# Patient Record
Sex: Female | Born: 2000 | Hispanic: Yes | Marital: Single | Smoking: Never smoker
Health system: Southern US, Community
[De-identification: ages and names within clinical notes are randomized; demographics above are authoritative.]

## PROBLEM LIST (undated history)

## (undated) ENCOUNTER — Inpatient Hospital Stay: Payer: Self-pay

## (undated) DIAGNOSIS — K8020 Calculus of gallbladder without cholecystitis without obstruction: Secondary | ICD-10-CM

## (undated) DIAGNOSIS — O9081 Anemia of the puerperium: Secondary | ICD-10-CM

## (undated) HISTORY — PX: NO PAST SURGERIES: SHX2092

---
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
## 2018-01-05 ENCOUNTER — Ambulatory Visit: Payer: Medicaid Other

## 2018-01-11 ENCOUNTER — Ambulatory Visit: Payer: Medicaid Other | Attending: Certified Nurse Midwife

## 2018-06-16 ENCOUNTER — Ambulatory Visit: Payer: Medicaid Other | Admitting: Certified Nurse Midwife

## 2018-06-16 VITALS — BP 111/59 | HR 64 | Ht 62.0 in | Wt 133.4 lb

## 2018-06-16 DIAGNOSIS — Z3046 Encounter for surveillance of implantable subdermal contraceptive: Secondary | ICD-10-CM

## 2018-06-16 NOTE — Progress Notes (Signed)
Pt is here for a nexplanon removal. Does not want any other form of bc.

## 2018-06-16 NOTE — Patient Instructions (Signed)
Preventive Care 18-39 Years, Female Preventive care refers to lifestyle choices and visits with your health care provider that can promote health and wellness. What does preventive care include?  A yearly physical exam. This is also called an annual well check.  Dental exams once or twice a year.  Routine eye exams. Ask your health care provider how often you should have your eyes checked.  Personal lifestyle choices, including: ? Daily care of your teeth and gums. ? Regular physical activity. ? Eating a healthy diet. ? Avoiding tobacco and drug use. ? Limiting alcohol use. ? Practicing safe sex. ? Taking vitamin and mineral supplements as recommended by your health care provider. What happens during an annual well check? The services and screenings done by your health care provider during your annual well check will depend on your age, overall health, lifestyle risk factors, and family history of disease. Counseling Your health care provider may ask you questions about your:  Alcohol use.  Tobacco use.  Drug use.  Emotional well-being.  Home and relationship well-being.  Sexual activity.  Eating habits.  Work and work Statistician.  Method of birth control.  Menstrual cycle.  Pregnancy history.  Screening You may have the following tests or measurements:  Height, weight, and BMI.  Diabetes screening. This is done by checking your blood sugar (glucose) after you have not eaten for a while (fasting).  Blood pressure.  Lipid and cholesterol levels. These may be checked every 5 years starting at age 66.  Skin check.  Hepatitis C blood test.  Hepatitis B blood test.  Sexually transmitted disease (STD) testing.  BRCA-related cancer screening. This may be done if you have a family history of breast, ovarian, tubal, or peritoneal cancers.  Pelvic exam and Pap test. This may be done every 3 years starting at age 40. Starting at age 59, this may be done every 5  years if you have a Pap test in combination with an HPV test.  Discuss your test results, treatment options, and if necessary, the need for more tests with your health care provider. Vaccines Your health care provider may recommend certain vaccines, such as:  Influenza vaccine. This is recommended every year.  Tetanus, diphtheria, and acellular pertussis (Tdap, Td) vaccine. You may need a Td booster every 10 years.  Varicella vaccine. You may need this if you have not been vaccinated.  HPV vaccine. If you are 69 or younger, you may need three doses over 6 months.  Measles, mumps, and rubella (MMR) vaccine. You may need at least one dose of MMR. You may also need a second dose.  Pneumococcal 13-valent conjugate (PCV13) vaccine. You may need this if you have certain conditions and were not previously vaccinated.  Pneumococcal polysaccharide (PPSV23) vaccine. You may need one or two doses if you smoke cigarettes or if you have certain conditions.  Meningococcal vaccine. One dose is recommended if you are age 27-21 years and a first-year college student living in a residence hall, or if you have one of several medical conditions. You may also need additional booster doses.  Hepatitis A vaccine. You may need this if you have certain conditions or if you travel or work in places where you may be exposed to hepatitis A.  Hepatitis B vaccine. You may need this if you have certain conditions or if you travel or work in places where you may be exposed to hepatitis B.  Haemophilus influenzae type b (Hib) vaccine. You may need this if  you have certain risk factors.  Talk to your health care provider about which screenings and vaccines you need and how often you need them. This information is not intended to replace advice given to you by your health care provider. Make sure you discuss any questions you have with your health care provider. Document Released: 01/18/2002 Document Revised: 08/11/2016  Document Reviewed: 09/23/2015 Elsevier Interactive Patient Education  [employer].

## 2018-06-16 NOTE — Progress Notes (Signed)
Susan Santos is a 17 y.o. year old Hispanic female here for Implanon removal.  Patient given informed consent for removal of her Implanon.  BP (!) 111/59   Pulse 64   Ht 5\' 2"  (1.575 m)   Wt 133 lb 7 oz (60.5 kg)   LMP 05/03/2018 (Approximate)   BMI 24.41 kg/m   Appropriate time out taken. Implanon site identified.  Area prepped in usual sterile fashon. One cc of 2% lidocaine was used to anesthetize the area at the distal end of the implant. A small stab incision was made right beside the implant on the distal portion.  The Implanon rod was grasped using hemostats and removed without difficulty.  There was less than 3 cc blood loss. There were no complications.  Steri-strips were applied over the small incision and a pressure bandage was applied.  The patient tolerated the procedure well.  She was instructed to keep the area clean and dry, remove pressure bandage in 24 hours, and keep insertion site covered with the steri-strip for 3-5 days.    Declines different method of contraception at this time. Encouraged condom use.   RTC as needed.    Gunnar BullaJenkins Michelle Meri Pelot, CNM [facility], [facility]

## 2019-02-19 ENCOUNTER — Emergency Department: Payer: Self-pay

## 2019-02-19 ENCOUNTER — Encounter: Payer: Self-pay | Admitting: Emergency Medicine

## 2019-02-19 ENCOUNTER — Other Ambulatory Visit: Payer: Self-pay

## 2019-02-19 ENCOUNTER — Emergency Department
Admission: EM | Admit: 2019-02-19 | Discharge: 2019-02-19 | Disposition: A | Discharge status: Home / Self Care | Payer: Self-pay | Attending: Emergency Medicine | Admitting: Emergency Medicine

## 2019-02-19 DIAGNOSIS — K8050 Calculus of bile duct without cholangitis or cholecystitis without obstruction: Secondary | ICD-10-CM | POA: Insufficient documentation

## 2019-02-19 DIAGNOSIS — R109 Unspecified abdominal pain: Secondary | ICD-10-CM

## 2019-02-19 DIAGNOSIS — K8020 Calculus of gallbladder without cholecystitis without obstruction: Secondary | ICD-10-CM

## 2019-02-19 LAB — POCT PREGNANCY, URINE: Preg Test, Ur: NEGATIVE

## 2019-02-19 LAB — CBC WITH DIFFERENTIAL/PLATELET
ABS IMMATURE GRANULOCYTES: 0.04 10*3/uL (ref 0.00–0.07)
BASOS PCT: 1 %
Basophils Absolute: 0.1 10*3/uL (ref 0.0–0.1)
Eosinophils Absolute: 0.1 10*3/uL (ref 0.0–0.5)
Eosinophils Relative: 1 %
HCT: 37.7 % (ref 36.0–46.0)
HEMOGLOBIN: 12.5 g/dL (ref 12.0–15.0)
IMMATURE GRANULOCYTES: 1 %
LYMPHS PCT: 21 %
Lymphs Abs: 1.9 10*3/uL (ref 0.7–4.0)
MCH: 29.7 pg (ref 26.0–34.0)
MCHC: 33.2 g/dL (ref 30.0–36.0)
MCV: 89.5 fL (ref 80.0–100.0)
MONO ABS: 0.4 10*3/uL (ref 0.1–1.0)
Monocytes Relative: 5 %
NEUTROS ABS: 6.4 10*3/uL (ref 1.7–7.7)
NEUTROS PCT: 71 %
PLATELETS: 230 10*3/uL (ref 150–400)
RBC: 4.21 MIL/uL (ref 3.87–5.11)
RDW: 12.2 % (ref 11.5–15.5)
WBC: 8.9 10*3/uL (ref 4.0–10.5)
nRBC: 0 % (ref 0.0–0.2)

## 2019-02-19 LAB — COMPREHENSIVE METABOLIC PANEL
ALK PHOS: 46 U/L (ref 38–126)
ALT: 8 U/L (ref 0–44)
AST: 13 U/L — AB (ref 15–41)
Albumin: 4 g/dL (ref 3.5–5.0)
Anion gap: 6 (ref 5–15)
BUN: 14 mg/dL (ref 6–20)
CHLORIDE: 106 mmol/L (ref 98–111)
CO2: 25 mmol/L (ref 22–32)
Calcium: 8.9 mg/dL (ref 8.9–10.3)
Creatinine, Ser: 0.66 mg/dL (ref 0.44–1.00)
GFR calc Af Amer: >60 mL/min (ref >60)
GFR calc non Af Amer: >60 mL/min (ref >60)
Glucose, Bld: 99 mg/dL (ref 70–99)
POTASSIUM: 4.2 mmol/L (ref 3.5–5.1)
SODIUM: 137 mmol/L (ref 135–145)
TOTAL PROTEIN: 7.4 g/dL (ref 6.5–8.1)
Total Bilirubin: 0.6 mg/dL (ref 0.3–1.2)

## 2019-02-19 LAB — URINALYSIS, COMPLETE (UACMP) WITH MICROSCOPIC
BILIRUBIN URINE: NEGATIVE
GLUCOSE, UA: NEGATIVE mg/dL
HGB URINE DIPSTICK: NEGATIVE
KETONES UR: NEGATIVE mg/dL
LEUKOCYTE UA: NEGATIVE
NITRITE: NEGATIVE
PH: 5 (ref 5.0–8.0)
Protein, ur: NEGATIVE mg/dL
SPECIFIC GRAVITY, URINE: 1.031 — AB (ref 1.005–1.030)

## 2019-02-19 LAB — LIPASE, BLOOD: LIPASE: 31 U/L (ref 11–51)

## 2019-02-19 MED ORDER — ONDANSETRON HCL 4 MG/2ML IJ SOLN
4.0000 mg | Freq: Once | INTRAMUSCULAR | Status: AC
  Administered 2019-02-19: 4 mg via INTRAVENOUS
  Filled 2019-02-19: qty 2

## 2019-02-19 MED ORDER — MORPHINE SULFATE (PF) 2 MG/ML IV SOLN
2.0000 mg | Freq: Once | INTRAVENOUS | Status: AC
  Administered 2019-02-19: 2 mg via INTRAVENOUS
  Filled 2019-02-19: qty 1

## 2019-02-19 MED ORDER — SODIUM CHLORIDE 0.9 % IV BOLUS
500.0000 mL | Freq: Once | INTRAVENOUS | Status: AC
  Administered 2019-02-19: 500 mL via INTRAVENOUS

## 2019-02-19 MED ORDER — MORPHINE SULFATE (PF) 4 MG/ML IV SOLN
3.0000 mg | Freq: Once | INTRAVENOUS | Status: AC
  Administered 2019-02-19: 3 mg via INTRAVENOUS
  Filled 2019-02-19: qty 1

## 2019-02-19 MED ORDER — KETOROLAC TROMETHAMINE 30 MG/ML IJ SOLN
30.0000 mg | Freq: Four times a day (QID) | INTRAMUSCULAR | Status: DC
  Administered 2019-02-19: 30 mg via INTRAVENOUS
  Filled 2019-02-19: qty 1

## 2019-02-19 NOTE — ED Notes (Signed)
Dr. Fanny Bien made aware pt tolerating PO

## 2019-02-19 NOTE — ED Triage Notes (Signed)
Pt states right upper rib pain that began a few weeks ago, went away and now is back, states pain radiates around to the right mid back as well. Appears in NAD. States she vomited this am.

## 2019-02-19 NOTE — Discharge Instructions (Addendum)
? ?  Please return to the emergency room right away if you are to develop a fever, severe nausea, your pain becomes severe or worsens, you are unable to keep food down, begin vomiting any dark or bloody fluid, you develop any dark or bloody stools, feel dehydrated, or other new concerns or symptoms arise. ? ?

## 2019-02-19 NOTE — ED Provider Notes (Signed)
[facility] Emergency Department Provider Note  ____________________________________________   First MD Initiated Contact with Patient 02/19/19 0825     (approximate)  I have reviewed the triage vital signs and the nursing notes.   HISTORY  Chief Complaint Abdominal Pain  HPI Susan Santos is a 18 y.o. female here for evaluation for upper abdominal pain  Patient reports that around 11 PM last night she started having pretty severe abdominal pain located in her right upper abdomen.  It was about a 10 out of 10 and she vomited once.  The pain is come down now to about a 5 out of 10 on its own after she took some over-the-counter pain medication which she is not quite certain what it was.  She reports no ongoing nausea or vomiting.  No fevers or chills.  Pain is moderate felt like a squeezing or pressing pain in her right upper abdomen that radiated to the back.  Is now seemingly improving slowly.  Denies pregnancy.  Reports last menstrual cycle about 1 month ago.  No lower abdominal pain.  No pelvic pain or discharge.  Reports she had this pain once before about a month ago and it went away on its own but she did not seek medical treatment at that time.  Past Medical History:  Diagnosis Date  . Postpartum anemia 07/26/2017    Patient Active Problem List   Diagnosis Date Noted  . Postpartum anemia 07/26/2017    History reviewed. No pertinent surgical history.  Prior to Admission medications   Not on File    Allergies Patient has no known allergies.  Family History  Problem Relation Age of Onset  . Diabetes Mother     Social History Social History   Tobacco Use  . Smoking status: Never Smoker  . Smokeless tobacco: Never Used  Substance Use Topics  . Alcohol use: No  . Drug use: No    Review of Systems Constitutional: No fever/chills Eyes: No visual changes. ENT: No sore throat. Cardiovascular: Denies chest pain. Respiratory:  Denies shortness of breath. Gastrointestinal: No abdominal pain.   Genitourinary: Negative for dysuria. Musculoskeletal: Negative for back pain. Skin: Negative for rash. Neurological: Negative for headaches, areas of focal weakness or numbness.    ____________________________________________   PHYSICAL EXAM:  VITAL SIGNS: ED Triage Vitals  Enc Vitals Group     BP 02/19/19 0833 99/69     Pulse Rate 02/19/19 0833 (!) 55     Resp 02/19/19 0833 16     Temp 02/19/19 0833 98 F (36.7 C)     Temp Source 02/19/19 0833 Oral     SpO2 02/19/19 0833 100 %     Weight 02/19/19 0741 120 lb (54.4 kg)     Height 02/19/19 0741 5\' 2"  (1.575 m)     Head Circumference --      Peak Flow --      Pain Score 02/19/19 0740 5     Pain Loc --      Pain Edu? --      Excl. in GC? --     Constitutional: Alert and oriented. Well appearing and in no acute distress. Eyes: Conjunctivae are normal. Head: Atraumatic. Nose: No congestion/rhinnorhea. Mouth/Throat: Mucous membranes are moist. Neck: No stridor.  Cardiovascular: Normal rate, regular rhythm. Grossly normal heart sounds.  Good peripheral circulation. Respiratory: Normal respiratory effort.  No retractions. Lungs CTAB. Gastrointestinal: Soft and nontender except for some mild discomfort and a slightly positive Eulah Pont  in the right upper quadrant without rebound or guarding.  There is no lower abdominal tenderness or left upper quadrant pain. No distention.  There is no peritonitis. Musculoskeletal: No lower extremity tenderness nor edema. Neurologic:  Normal speech and language. No gross focal neurologic deficits are appreciated.  Skin:  Skin is warm, dry and intact. No rash noted. Psychiatric: Mood and affect are normal. Speech and behavior are normal.  ____________________________________________   LABS (all labs ordered are listed, but only abnormal results are displayed)  Labs Reviewed  COMPREHENSIVE METABOLIC PANEL - Abnormal; Notable  for the following components:      Result Value   AST 13 (*)    All other components within normal limits  URINALYSIS, COMPLETE (UACMP) WITH MICROSCOPIC - Abnormal; Notable for the following components:   Color, Urine YELLOW (*)    APPearance CLOUDY (*)    Specific Gravity, Urine 1.031 (*)    Bacteria, UA RARE (*)    All other components within normal limits  CBC WITH DIFFERENTIAL/PLATELET  LIPASE, BLOOD  POC URINE PREG, ED  POCT PREGNANCY, URINE   ____________________________________________  EKG   ____________________________________________  RADIOLOGY  Right upper quadrant ultrasound reviewed by me.  Cholelithiasis.  No signs of cholecystitis noted ____________________________________________   PROCEDURES  Procedure(s) performed: None  Procedures  Critical Care performed: No  ____________________________________________   INITIAL IMPRESSION / ASSESSMENT AND PLAN / ED COURSE  Pertinent labs & imaging results that were available during my care of the patient were reviewed by me and considered in my medical decision making (see chart for details).   Right upper quadrant pathology after eating.  Recurrent symptomatology had similar about a month ago.  Currently afebrile pain is subsiding with over-the-counter medication.  I suspect this is likely hepatobiliary in nature likely cholelithiasis less likely cholecystitis as she is afebrile normal white count without peritonitis and her pain is subsiding.  No signs or symptoms that would suggest acute pancreatitis, appendicitis, or acute abdomen denoted.  Clinical Course as of Feb 19 1347  Mon Feb 19, 2019  1017 General surgery consultation placed.  None symptomatic cholelithiasis suspected, no obvious evidence of cholecystitis.  Patient does however note mild ongoing right upper quadrant pain.  She is fully awake and alert, will place a consultation for further treatment recommendations.  Give additional small dose of  morphine at this time.   [MQ]  1251 Patient is resting comfortably, Dr. Aleen Campi of general surgery has seen her and recommends discharge as long as she passes p.o. challenge and follow-up in the clinic this week.  Biliary colic.  Patient comfortable resting at this time agreeable   [MQ]    Clinical Course User Index [MQ] Sharyn Creamer, MD    Patient did well with p.o. challenge, comfortable plan for discharge and will follow-up in general surgery clinic.  Careful return precautions advised ____________________________________________   FINAL CLINICAL IMPRESSION(S) / ED DIAGNOSES  Final diagnoses:  Abdominal pain  Biliary colic        Note:  This document was prepared using Dragon voice recognition software and may include unintentional dictation errors       Sharyn Creamer, MD 02/19/19 1348

## 2019-02-19 NOTE — ED Notes (Signed)
PO challenge-warm soda and apple sauce provided to pt by Dr. Fanny Bien

## 2019-02-19 NOTE — ED Notes (Signed)
Surgery consult happening  at this time

## 2019-02-19 NOTE — Consult Note (Signed)
[facility] SURGICAL CONSULTATION NOTE (initial) - cpt: 82500 (Outpatient/ED)   HISTORY OF PRESENT ILLNESS (HPI):  18 y.o. female presented to [facility] ED today for evaluation of abdominal pain. Patient reports that she had the acute onset of burning RUQ abdominal pain radiating to her back. The pain spontaneously resolved. However, she presents today because the pain returned overnight. She notes associated nausea and 1 episode of emesis. No associated fevers, chills, CP, SOB, or bladder/bowel changes. Pain is not associated with food. No history of similar pain in the past. No previous abdominal surgeries. Work up in the ED was concerning for cholelithiasis.   Surgery is consulted by emergency medicine physician Dr. Sharyn Creamer, MD in this context for evaluation and management of symptomatic cholelithiasis.  PAST MEDICAL HISTORY (PMH):  Past Medical History:  Diagnosis Date  . Postpartum anemia 07/26/2017     PAST SURGICAL HISTORY (PSH):  History reviewed. No pertinent surgical history.   MEDICATIONS:  Prior to Admission medications   Not on File     ALLERGIES:  No Known Allergies   SOCIAL HISTORY:  Social History   Socioeconomic History  . Marital status: Single    Spouse name: Not on file  . Number of children: Not on file  . Years of education: Not on file  . Highest education level: Not on file  Occupational History  . Not on file  Social Needs  . Financial resource strain: Not on file  . Food insecurity:    Worry: Not on file    Inability: Not on file  . Transportation needs:    Medical: Not on file    Non-medical: Not on file  Tobacco Use  . Smoking status: Never Smoker  . Smokeless tobacco: Never Used  Substance and Sexual Activity  . Alcohol use: No  . Drug use: No  . Sexual activity: Yes    Birth control/protection: None  Lifestyle  . Physical activity:    Days per week: Not on file    Minutes per session: Not on file  . Stress: Not on  file  Relationships  . Social connections:    Talks on phone: Not on file    Gets together: Not on file    Attends religious service: Not on file    Active member of club or organization: Not on file    Attends meetings of clubs or organizations: Not on file    Relationship status: Not on file  . Intimate partner violence:    Fear of current or ex partner: Not on file    Emotionally abused: Not on file    Physically abused: Not on file    Forced sexual activity: Not on file  Other Topics Concern  . Not on file  Social History Narrative  . Not on file     FAMILY HISTORY:  Family History  Problem Relation Age of Onset  . Diabetes Mother       REVIEW OF SYSTEMS:  Review of Systems  Constitutional: Negative for chills and fever.  Respiratory: Negative for cough and shortness of breath.   Cardiovascular: Negative for chest pain and palpitations.  Gastrointestinal: Positive for abdominal pain, nausea and vomiting. Negative for blood in stool, constipation and diarrhea.  Genitourinary: Negative for dysuria and urgency.  Neurological: Negative for dizziness and headaches.  All other systems reviewed and are negative.   VITAL SIGNS:  Temp:  [98 F (36.7 C)] 98 F (36.7 C) (03/16 0833) Pulse Rate:  [52-55]  52 (03/16 7867) Resp:  [16] 16 (03/16 0833) BP: (99)/(69) 99/69 (03/16 0833) SpO2:  [100 %] 100 % (03/16 0833) Weight:  [54.4 kg] 54.4 kg (03/16 0741)     Height: 5\' 2"  (157.5 cm) Weight: 54.4 kg BMI (Calculated): 21.94   INTAKE/OUTPUT:  This shift: No intake/output data recorded.  Last 2 shifts: @IOLAST2SHIFTS @   PHYSICAL EXAM:  Physical Exam Vitals signs and nursing note reviewed.  Constitutional:      General: She is not in acute distress.    Appearance: She is well-developed and normal weight. She is not ill-appearing.  HENT:     Head: Normocephalic and atraumatic.  Eyes:     General: No scleral icterus.    Extraocular Movements: Extraocular movements intact.   Cardiovascular:     Rate and Rhythm: Normal rate and regular rhythm.     Heart sounds: Normal heart sounds. No murmur. No friction rub. No gallop.   Pulmonary:     Effort: Pulmonary effort is normal. No respiratory distress.     Breath sounds: Normal breath sounds. No wheezing or rhonchi.  Abdominal:     General: Abdomen is flat. There is no distension.     Palpations: Abdomen is soft.     Tenderness: There is abdominal tenderness (Mild) in the right upper quadrant. There is no guarding or rebound. Negative signs include Murphy's sign.  Genitourinary:    Comments: Deferred Skin:    General: Skin is warm and dry.     Coloration: Skin is not jaundiced or pale.  Neurological:     General: No focal deficit present.     Mental Status: She is alert and oriented to person, place, and time.  Psychiatric:        Mood and Affect: Mood normal.        Behavior: Behavior normal.       Labs:  CBC Latest Ref Rng & Units 02/19/2019 12/19/2017 09/19/2017  WBC 4.0 - 10.5 K/uL 8.9 4.6 5.2  Hemoglobin 12.0 - 15.0 g/dL 67.2 09.4 70.9  Hematocrit 36.0 - 46.0 % 37.7 38.7 38.6  Platelets 150 - 400 K/uL 230 189 244   CMP Latest Ref Rng & Units 02/19/2019 12/19/2017 02/07/2014  Glucose 70 - 99 mg/dL 99 628(Z) 87  BUN 6 - 20 mg/dL 14 15 16   Creatinine 0.44 - 1.00 mg/dL 6.62 9.47 6.54  Sodium 135 - 145 mmol/L 137 140 138  Potassium 3.5 - 5.1 mmol/L 4.2 3.9 4.0  Chloride 98 - 111 mmol/L 106 109 107  CO2 22 - 32 mmol/L 25 25 27(H)  Calcium 8.9 - 10.3 mg/dL 8.9 9.3 9.1(L)  Total Protein 6.5 - 8.1 g/dL 7.4 - 7.8  Total Bilirubin 0.3 - 1.2 mg/dL 0.6 - 0.3  Alkaline Phos 38 - 126 U/L 46 - 80  AST 15 - 41 U/L 13(L) - 11(L)  ALT 0 - 44 U/L 8 - 12     Imaging studies:   RUQ [place] (02/19/2019) personally reviewed and radiologist report reviewed:  IMPRESSION: 1. Cholelithiasis. The majority of the stones are mobile, however 1 stone in the gallbladder neck did not move during the course of the examination and  may represent a source for biliary colic. 2. No secondary sonographic findings of acute cholecystitis.   Assessment/Plan: (ICD-10's: K71.20) 18 y.o. female with RUQ abdominal pain most likely attributable to symptomatic cholelithiasis without leukocytosis, cholecystitis, or choledocholithiasis.   - PO Challenge  - Will add 30 mg of Toradol for pain control   -  Given improvement in pain and lack of evidence of cholecystitis, will discharge home with close follow up for discussion of elective cholecystectomy rather than admission for cholecystectomy at this time, which she is understanding of and agreeable with.   - Will recommend low fat diet at home to minimize recurrence  - Tylenol/Motrin for pain  - Follow up with Dr Aleen CampiPiscoya in 1 week   All of the above findings and recommendations were discussed with the patient and the medical team, and all of patient's questions were answered to her expressed satisfaction.  Thank you for the opportunity to participate in this patient's care.   -- Lynden OxfordZachary Schulz, PA-C [facility] 02/19/2019, 11:10 AM [phone] M-F: 7am - 4pm

## 2019-03-07 ENCOUNTER — Encounter: Payer: Self-pay | Admitting: Surgery

## 2019-03-07 ENCOUNTER — Other Ambulatory Visit: Payer: Self-pay

## 2019-03-07 ENCOUNTER — Ambulatory Visit (INDEPENDENT_AMBULATORY_CARE_PROVIDER_SITE_OTHER): Payer: Self-pay | Admitting: Surgery

## 2019-03-07 ENCOUNTER — Telehealth: Payer: Self-pay | Admitting: *Deleted

## 2019-03-07 DIAGNOSIS — K8020 Calculus of gallbladder without cholecystitis without obstruction: Secondary | ICD-10-CM | POA: Insufficient documentation

## 2019-03-07 MED ORDER — OXYCODONE HCL 5 MG PO TABS: 5.0000 mg | ORAL_TABLET | Freq: Four times a day (QID) | ORAL | 0 refills | Status: DC | PRN

## 2019-03-07 NOTE — Progress Notes (Signed)
Virtual Visit via Video Note  I connected with Susan Santos on 03/07/19 at  9:00 AM EDT by a video enabled telemedicine application and verified that I am speaking with the correct person using two identifiers.   I discussed the limitations of evaluation and management by telemedicine and the availability of in person appointments. The patient expressed understanding and agreed to proceed.  This service was provided via telemedicine.  The patient consented to the visit being carried via telemedicine.  Patient's location:  Home  Provider's location:  Office  Referring Provider:  None  People participating in this telemedicine visit:  Myself, patient  Time spent:  15 minutes   History of Present Illness: 17 yo female seen in ED on 3/16 with abdominal pain and symptomatic cholelithiasis.  She was discharged home and presents today via FaceTime for virtual visit.  Patient reports that over the past 2 weeks she has had 2 episodes of right upper quadrant pain that radiates to the back associated with nausea.  These are the same kind of symptoms that she had before when she went to emergency department except that she has not had any vomiting.  The first episode was not too significant and the last episode was a little more severe but she did not go to the hospital.  She has tried a low-fat diet and is avoiding greasy foods as well as sodas she does take Advil 2 tablets as needed but she has noticed that this has not helped much.   Observations/Objective: No acute pain distress No jaundice Respiratory effort normal  Assessment and Plan: 18 year old female with symptomatic cholelithiasis.  -Discussed with the patient and currently with her covered restrictions were not able to do elective cases.  However at the same time I want to make sure that she is not miserable at home and pain and nausea.  Discussed with the patient that she can take ibuprofen up to 800 mg at a time every 8 hours as  needed for pain.  I will also send a prescription for oxycodone 5 mg tablets to take every 6 hours as needed for severe pain for total of 20 tablets.  He should also continue avoiding a low-fat diet and described her what some of the greasy foods are to avoid.  I instructed the patient that if this regimen does not help and she continues to have episodes of pain that is now well controlled, then she should call us back so that we can set up a in office appointment to discuss possibly discuss surgery. -Otherwise the patient continues to do well with control symptoms with her medications, then we will follow-up with her in 1 month with another FaceTime virtual video visit to discuss her symptoms and whether the restrictions have been lifted or not to schedule her for surgery in the future.  She understands this plan and all of her questions have been answered.  Follow Up Instructions:    I discussed the assessment and treatment plan with the patient. The patient was provided an opportunity to ask questions and all were answered. The patient agreed with the plan and demonstrated an understanding of the instructions.   The patient was advised to call back or seek an in-person evaluation if the symptoms worsen or if the condition fails to improve as anticipated.  I provided 15 minutes of non-face-to-face time during this encounter.   Henrene Dodge, MD

## 2019-03-07 NOTE — Telephone Encounter (Signed)
Patient needs to be scheduled for a face time virtual visit for one month with Dr. Aleen Campi.   I did call patient today but no answer and not able to leave a message.

## 2019-03-09 NOTE — Telephone Encounter (Signed)
Tried to reach patient today but no answer and not able to leave a message.

## 2019-03-13 ENCOUNTER — Telehealth: Payer: Self-pay | Admitting: *Deleted

## 2019-03-13 NOTE — Telephone Encounter (Signed)
Spoke with the patient today and face time virtual visit with Dr. Aleen Campi has been scheduled for 03-30-19 at 10 am.  Patient verbalizes understanding.

## 2019-03-30 ENCOUNTER — Telehealth: Payer: Medicaid Other | Admitting: Surgery

## 2019-03-30 ENCOUNTER — Other Ambulatory Visit: Payer: Self-pay

## 2019-05-30 ENCOUNTER — Encounter: Payer: Self-pay | Admitting: Emergency Medicine

## 2019-05-30 ENCOUNTER — Other Ambulatory Visit: Payer: Self-pay

## 2019-05-30 ENCOUNTER — Emergency Department
Admission: EM | Admit: 2019-05-30 | Discharge: 2019-05-30 | Disposition: A | Discharge status: Home / Self Care | Payer: Self-pay | Attending: Emergency Medicine | Admitting: Emergency Medicine

## 2019-05-30 ENCOUNTER — Emergency Department: Payer: Self-pay

## 2019-05-30 DIAGNOSIS — K8050 Calculus of bile duct without cholangitis or cholecystitis without obstruction: Secondary | ICD-10-CM

## 2019-05-30 DIAGNOSIS — Z79899 Other long term (current) drug therapy: Secondary | ICD-10-CM | POA: Insufficient documentation

## 2019-05-30 DIAGNOSIS — K8020 Calculus of gallbladder without cholecystitis without obstruction: Secondary | ICD-10-CM | POA: Insufficient documentation

## 2019-05-30 HISTORY — DX: Calculus of gallbladder without cholecystitis without obstruction: K80.20

## 2019-05-30 LAB — CBC WITH DIFFERENTIAL/PLATELET
Abs Immature Granulocytes: 0.01 10*3/uL (ref 0.00–0.07)
Basophils Absolute: 0.1 10*3/uL (ref 0.0–0.1)
Basophils Relative: 1 %
Eosinophils Absolute: 0.1 10*3/uL (ref 0.0–0.5)
Eosinophils Relative: 1 %
HCT: 38.4 % (ref 36.0–46.0)
Hemoglobin: 12.9 g/dL (ref 12.0–15.0)
Immature Granulocytes: 0 %
Lymphocytes Relative: 41 %
Lymphs Abs: 3.5 10*3/uL (ref 0.7–4.0)
MCH: 29.8 pg (ref 26.0–34.0)
MCHC: 33.6 g/dL (ref 30.0–36.0)
MCV: 88.7 fL (ref 80.0–100.0)
Monocytes Absolute: 0.6 10*3/uL (ref 0.1–1.0)
Monocytes Relative: 7 %
Neutro Abs: 4.3 10*3/uL (ref 1.7–7.7)
Neutrophils Relative %: 50 %
Platelets: 245 10*3/uL (ref 150–400)
RBC: 4.33 MIL/uL (ref 3.87–5.11)
RDW: 11.7 % (ref 11.5–15.5)
WBC: 8.5 10*3/uL (ref 4.0–10.5)
nRBC: 0 % (ref 0.0–0.2)

## 2019-05-30 LAB — COMPREHENSIVE METABOLIC PANEL
ALT: 13 U/L (ref 0–44)
AST: 15 U/L (ref 15–41)
Albumin: 4.2 g/dL (ref 3.5–5.0)
Alkaline Phosphatase: 56 U/L (ref 38–126)
Anion gap: 9 (ref 5–15)
BUN: 14 mg/dL (ref 6–20)
CO2: 26 mmol/L (ref 22–32)
Calcium: 9.5 mg/dL (ref 8.9–10.3)
Chloride: 105 mmol/L (ref 98–111)
Creatinine, Ser: 0.59 mg/dL (ref 0.44–1.00)
GFR calc Af Amer: >60 mL/min (ref >60)
GFR calc non Af Amer: >60 mL/min (ref >60)
Glucose, Bld: 111 mg/dL — ABNORMAL HIGH (ref 70–99)
Potassium: 3.7 mmol/L (ref 3.5–5.1)
Sodium: 140 mmol/L (ref 135–145)
Total Bilirubin: 0.5 mg/dL (ref 0.3–1.2)
Total Protein: 7.5 g/dL (ref 6.5–8.1)

## 2019-05-30 LAB — URINALYSIS, ROUTINE W REFLEX MICROSCOPIC
Bacteria, UA: NONE SEEN
Bilirubin Urine: NEGATIVE
Glucose, UA: NEGATIVE mg/dL
Hgb urine dipstick: NEGATIVE
Ketones, ur: NEGATIVE mg/dL
Nitrite: NEGATIVE
Protein, ur: NEGATIVE mg/dL
Specific Gravity, Urine: 1.017 (ref 1.005–1.030)
pH: 7 (ref 5.0–8.0)

## 2019-05-30 LAB — PREGNANCY, URINE: Preg Test, Ur: NEGATIVE

## 2019-05-30 LAB — LIPASE, BLOOD: Lipase: 38 U/L (ref 11–51)

## 2019-05-30 MED ORDER — MORPHINE SULFATE (PF) 4 MG/ML IV SOLN
4.0000 mg | Freq: Once | INTRAVENOUS | Status: AC
  Administered 2019-05-30: 4 mg via INTRAVENOUS
  Filled 2019-05-30: qty 1

## 2019-05-30 MED ORDER — ONDANSETRON HCL 4 MG/2ML IJ SOLN
4.0000 mg | INTRAMUSCULAR | Status: AC
  Administered 2019-05-30: 4 mg via INTRAVENOUS
  Filled 2019-05-30: qty 2

## 2019-05-30 MED ORDER — OXYCODONE-ACETAMINOPHEN 5-325 MG PO TABS
2.0000 | ORAL_TABLET | Freq: Once | ORAL | Status: AC
  Administered 2019-05-30: 2 via ORAL
  Filled 2019-05-30: qty 2

## 2019-05-30 MED ORDER — DOCUSATE SODIUM 100 MG PO CAPS: ORAL_CAPSULE | ORAL | 0 refills | Status: DC

## 2019-05-30 MED ORDER — KETOROLAC TROMETHAMINE 30 MG/ML IJ SOLN
15.0000 mg | Freq: Once | INTRAMUSCULAR | Status: AC
  Administered 2019-05-30: 15 mg via INTRAVENOUS
  Filled 2019-05-30: qty 1

## 2019-05-30 NOTE — Discharge Instructions (Signed)
You have been seen in the Emergency Department (ED) for abdominal pain.  Your evaluation suggests that your pain is caused by gallstones.  Fortunately you do not need immediate surgery at this time, but it is important that you follow up with a surgeon as an outpatient; typically surgical removal of the gallbladder is the only thing that will definitively fix your issue.  Read through the included information about a bland diet, and use any prescribed medications as instructed.  Avoid smoking and alcohol use.  Please follow up as instructed above regarding todays emergent visit and the symptoms that are bothering you.  Please pick up the pain medication prescribed by Dr. Hampton Abbot.  We also recommend you use a stool softener such as Colace or Dulcolax, because taking strong pain medication can make you constipated.  Return to the ED if your abdominal pain worsens or fails to improve, you develop bloody vomiting, bloody diarrhea, you are unable to tolerate fluids due to vomiting, fever greater than 101, or other symptoms that concern you.

## 2019-05-30 NOTE — ED Provider Notes (Signed)
[facility] Emergency Department Provider Note  ____________________________________________   First MD Initiated Contact with Patient 05/30/19 [phone]     (approximate)  I have reviewed the triage vital signs and the nursing notes.   HISTORY  Chief Complaint Abdominal Pain    HPI Susan Santos is a 18 y.o. female who reports a prior history of gallstones and presents tonight by private vehicle for evaluation of acute onset and severe pain in her upper abdomen radiating slightly through to the back and present in the right upper quadrant.   She has had some nausea but no vomiting.  Nothing in particular makes his symptoms better or worse she has had symptoms like this before and they seem to be worse after she eats.  She has had no contact with anyone known to have COVID-19.  She denies fever/chills, sore throat, chest pain, cough, shortness of breath, and dysuria.        Past Medical History:  Diagnosis Date  . Gallstones   . Postpartum anemia 07/26/2017    Patient Active Problem List   Diagnosis Date Noted  . Symptomatic cholelithiasis 03/07/2019  . Postpartum anemia 07/26/2017    History reviewed. No pertinent surgical history.  Prior to Admission medications   Medication Sig Start Date End Date Taking? Authorizing Provider  docusate sodium (COLACE) 100 MG capsule Take 1 tablet once or twice daily as needed for constipation while taking narcotic pain medicine 05/30/19   Hinda Kehr, MD  oxyCODONE (ROXICODONE) 5 MG immediate release tablet Take 1 tablet (5 mg total) by mouth every 6 (six) hours as needed for severe pain. 03/07/19   Olean Ree, MD    Allergies Patient has no known allergies.  Family History  Problem Relation Age of Onset  . Diabetes Mother     Social History Social History   Tobacco Use  . Smoking status: Never Smoker  . Smokeless tobacco: Never Used  Substance Use Topics  . Alcohol use: No  . Drug use: No     Review of Systems Constitutional: No fever/chills Eyes: No visual changes. ENT: No sore throat. Cardiovascular: Denies chest pain. Respiratory: Denies shortness of breath. Gastrointestinal: Abdominal pain as described above, nausea, no vomiting. Genitourinary: Negative for dysuria. Musculoskeletal: Negative for neck pain.  Negative for back pain. Integumentary: Negative for rash. Neurological: Negative for headaches, focal weakness or numbness.   ____________________________________________   PHYSICAL EXAM:  VITAL SIGNS: ED Triage Vitals [05/30/19 0319]  Enc Vitals Group     BP 123/64     Pulse Rate 67     Resp 18     Temp 98.6 F (37 C)     Temp Source Oral     SpO2 99 %     Weight 53.5 kg (118 lb)     Height 1.575 m (5\' 2" )     Head Circumference      Peak Flow      Pain Score 7     Pain Loc      Pain Edu?      Excl. in [place]?     Constitutional: Alert and oriented. Well appearing and in no acute distress. Eyes: Conjunctivae are normal.  Head: Atraumatic. Nose: No congestion/rhinnorhea. Mouth/Throat: Mucous membranes are moist. Neck: No stridor.  No meningeal signs.   Cardiovascular: Normal rate, regular rhythm. Good peripheral circulation. Grossly normal heart sounds. Respiratory: Normal respiratory effort.  No retractions. No audible wheezing. Gastrointestinal: Soft and nondistended.  Tender to palpation in  the epigastrium and right upper quadrant with positive Murphy sign.  No peritonitis and no lower abdominal tenderness to palpation. Musculoskeletal: No lower extremity tenderness nor edema. No gross deformities of extremities. Neurologic:  Normal speech and language. No gross focal neurologic deficits are appreciated.  Skin:  Skin is warm, dry and intact. No rash noted. Psychiatric: Mood and affect are normal. Speech and behavior are normal.  ____________________________________________   LABS (all labs ordered are listed, but only abnormal results are  displayed)  Labs Reviewed  COMPREHENSIVE METABOLIC PANEL - Abnormal; Notable for the following components:      Result Value   Glucose, Bld 111 (*)    All other components within normal limits  URINALYSIS, ROUTINE W REFLEX MICROSCOPIC - Abnormal; Notable for the following components:   Color, Urine YELLOW (*)    APPearance HAZY (*)    Leukocytes,Ua LARGE (*)    All other components within normal limits  CBC WITH DIFFERENTIAL/PLATELET  LIPASE, BLOOD  PREGNANCY, URINE   ____________________________________________  EKG  No indication for EKG ____________________________________________  RADIOLOGY   ED MD interpretation: Multiple gallstones with 2 in the neck of the gallbladder.  Official radiology report(s): [place] Abdomen Limited Ruq  Result Date: 05/30/2019 CLINICAL DATA:  Right upper quadrant pain radiating to the back. History of gallstones EXAM: ULTRASOUND ABDOMEN LIMITED RIGHT UPPER QUADRANT COMPARISON:  02/19/2019 FINDINGS: Gallbladder: Multiple shadowing gallstones with 2 fixed at the gallbladder neck. No wall thickening, focal tenderness, or pericholecystic edema. Common bile duct: Diameter: 3 mm.  Where visualized, no filling defect. Liver: No focal lesion identified. Within normal limits in parenchymal echogenicity. Portal vein is patent on color Doppler imaging with normal direction of blood flow towards the liver. IMPRESSION: Cholelithiasis with 2 stones fixed in the gallbladder neck. No indication of acute cholecystitis. Electronically Signed   By: Marnee SpringJonathon  Watts M.D.   On: 05/30/2019 04:59    ____________________________________________   PROCEDURES   Procedure(s) performed (including Critical Care):  Procedures   ____________________________________________   INITIAL IMPRESSION / MDM / ASSESSMENT AND PLAN / ED COURSE  As part of my medical decision making, I reviewed the following data within the electronic MEDICAL RECORD NUMBER Nursing notes reviewed and  incorporated, Labs reviewed , Old chart reviewed, Notes from prior ED visits and [place] Controlled Substance Database      *Susan Santos was evaluated in Emergency Department on 05/30/2019 for the symptoms described in the history of present illness. She was evaluated in the context of the global COVID-19 pandemic, which necessitated consideration that the patient might be at risk for infection with the SARS-CoV-2 virus that causes COVID-19. Institutional protocols and algorithms that pertain to the evaluation of patients at risk for COVID-19 are in a state of rapid change based on information released by regulatory bodies including the CDC and federal and state organizations. These policies and algorithms were followed during the patient's care in the ED.  Some ED evaluations and interventions may be delayed as a result of limited staffing during the pandemic.*  Differential diagnosis includes, but is not limited to, biliary disease including colic, cholecystitis, choledocholithiasis, ascending cholangitis; pancreatitis; nonspecific gastritis or GERD; less likely appendicitis.  The patient is having no pelvic complaints or concerns.  Vital signs are stable and she is afebrile with no tachycardia.  Comprehensive metabolic panel is normal with no LFT elevations, lipase is normal, CBC with differential is normal with no leukocytosis.  Urinalysis is pending.  Ultrasound is pending as well.  I  will reassess after the results of the ultrasound.  Clinical Course as of May 29 521  Wed May 30, 2019  0520 The patient has multiple gallstones including 2 that were in the neck of the gallbladder.  However, when I reassessed the patient she looked much more comfortable and was watching TV in no distress.  Upon reexamination she has no right upper quadrant tenderness and only a little bit of residual epigastric tenderness.  I believe the stones are no longer stuck in the neck of the gallbladder once she relaxed after  analgesia.  At this point I do not believe there is an indication for admission.  She is comfortable with the plan to be discharged and follow-up with Dr. Aleen CampiPiscoya.  I reviewed the medical record and saw that he has already prescribed her pain medicine which she says she can pick up from the pharmacy later today.  I am giving her a dose of Percocet 2 tablets by mouth prior to discharge to allow for the residual discomfort until she can get to the pharmacy.  I gave my usual and customary return precautions and she understands and agrees with the plan.  US ABDOMEN LIMITED RUQ [CF]    Clinical Course User Index [CF] Loleta RoseForbach, Safal Halderman, MD     ____________________________________________  FINAL CLINICAL IMPRESSION(S) / ED DIAGNOSES  Final diagnoses:  Biliary colic     MEDICATIONS GIVEN DURING THIS VISIT:  Medications  oxyCODONE-acetaminophen (PERCOCET/ROXICET) 5-325 MG per tablet 2 tablet (has no administration in time range)  ketorolac (TORADOL) 30 MG/ML injection 15 mg (15 mg Intravenous Given 05/30/19 0453)  ondansetron (ZOFRAN) injection 4 mg (4 mg Intravenous Given 05/30/19 0453)  morphine 4 MG/ML injection 4 mg (4 mg Intravenous Given 05/30/19 0453)     ED Discharge Orders         Ordered    docusate sodium (COLACE) 100 MG capsule     05/30/19 0520           Note:  This document was prepared using Dragon voice recognition software and may include unintentional dictation errors.   Loleta RoseForbach, Hadley Detloff, MD 05/30/19 [phone]

## 2019-05-30 NOTE — ED Triage Notes (Signed)
Patient ambulatory to triage with steady gait, without difficulty or distress noted, mask in place; pt reports rt upper abd pain radiating into back that began tonight; st has known gallstones

## 2019-05-31 ENCOUNTER — Encounter: Payer: Self-pay | Admitting: Surgery

## 2019-05-31 ENCOUNTER — Encounter
Admission: RE | Admit: 2019-05-31 | Discharge: 2019-05-31 | Disposition: A | Discharge status: Home / Self Care | Payer: Medicaid Other | Source: Ambulatory Visit | Attending: Surgery | Admitting: Surgery

## 2019-05-31 ENCOUNTER — Other Ambulatory Visit: Payer: Self-pay

## 2019-05-31 ENCOUNTER — Ambulatory Visit (INDEPENDENT_AMBULATORY_CARE_PROVIDER_SITE_OTHER): Payer: Self-pay | Admitting: Surgery

## 2019-05-31 VITALS — BP 103/55 | HR 53 | Temp 98.1°F | Wt 124.0 lb

## 2019-05-31 DIAGNOSIS — Z01812 Encounter for preprocedural laboratory examination: Secondary | ICD-10-CM | POA: Insufficient documentation

## 2019-05-31 DIAGNOSIS — U071 COVID-19: Secondary | ICD-10-CM | POA: Insufficient documentation

## 2019-05-31 DIAGNOSIS — K8020 Calculus of gallbladder without cholecystitis without obstruction: Secondary | ICD-10-CM

## 2019-05-31 NOTE — Patient Instructions (Signed)
You have requested to have your Gallbladder removed. We will arrange this to be done on  at [place] will be off from work for approximately 1-2 weeks depending on your recovery.   Please avoid greasy and fried foods if at all possible prior to your scheduled surgery to decrease symptoms until then.  Please see the [facility]) pre-care form you have been given today.  If you have any questions or concerns please call our office.

## 2019-05-31 NOTE — Patient Instructions (Signed)
Your procedure is scheduled on: 06-06-19 [facility] Report to Same Day Surgery 2nd floor medical mall [facility] Entrance-take elevator on left to 2nd floor.  Check in with surgery information desk.) To find out your arrival time please call [phone] between 1PM - 3PM on 06-05-19 TUESDAY  Remember: Instructions that are not followed completely may result in serious medical risk, up to and including death, or upon the discretion of your surgeon and anesthesiologist your surgery may need to be rescheduled.    _x___ 1. Do not eat food after midnight the night before your procedure. NO GUM OR CANDY AFTER MIDNIGHT. You may drink clear liquids up to 2 hours before you are scheduled to arrive at the hospital for your procedure.  Do not drink clear liquids within 2 hours of your scheduled arrival to the hospital.  Clear liquids include  --Water or Apple juice without pulp  --Clear carbohydrate beverage such as ClearFast or Gatorade  --Black Coffee or Clear Tea (No milk, no creamers, do not add anything to the coffee or Tea   ____Ensure clear carbohydrate drink on the way to the hospital for bariatric patients  ____Ensure clear carbohydrate drink 3 hours before surgery for Dr Dwyane Luo patients if physician instructed.    __x__ 2. No Alcohol for 24 hours before or after surgery.   __x__3. No Smoking or e-cigarettes for 24 prior to surgery.  Do not use any chewable tobacco products for at least 6 hour prior to surgery   ____  4. Bring all medications with you on the day of surgery if instructed.    __x__ 5. Notify your doctor if there is any change in your medical condition     (cold, fever, infections).    x___6. On the morning of surgery brush your teeth with toothpaste and water.  You may rinse your mouth with mouth wash if you wish.  Do not swallow any toothpaste or mouthwash.   Do not wear jewelry, make-up, hairpins, clips or nail polish.  Do not wear lotions, powders, or perfumes.  You may wear deodorant.  Do not shave 48 hours prior to surgery. Men may shave face and neck.  Do not bring valuables to the hospital.    [facility] is not responsible for any belongings or valuables.               Contacts, dentures or bridgework may not be worn into surgery.  Leave your suitcase in the car. After surgery it may be brought to your room.  For patients admitted to the hospital, discharge time is determined by your treatment team.  _  Patients discharged the day of surgery will not be allowed to drive home.  You will need someone to drive you home and stay with you the night of your procedure.    Please read over the following fact sheets that you were given:   [facility] Preparing for Surgery   ____ Take anti-hypertensive listed below, cardiac, seizure, asthma, anti-reflux and psychiatric medicines. These include:  1. NONE  2.  3.  4.  5.  6.  ____Fleets enema or Magnesium Citrate as directed.   _x___ Use CHG Soap or sage wipes as directed on instruction sheet   ____ Use inhalers on the day of surgery and bring to hospital day of surgery  ____ Stop Metformin and Janumet 2 days prior to surgery.    ____ Take 1/2 of usual insulin dose the night before surgery and none on the  morning surgery.   ____ Follow recommendations from Cardiologist, Pulmonologist or PCP regarding stopping Aspirin, Coumadin, Plavix ,Eliquis, Effient, or Pradaxa, and Pletal.  X____Stop Anti-inflammatories such as Advil, Aleve, Ibuprofen, Motrin, Naproxen, Naprosyn, Goodies powders or aspirin products NOW-OK to take Tylenol    ____ Stop supplements until after surgery.    ____ Bring C-Pap to the hospital.

## 2019-05-31 NOTE — H&P (View-Only) (Signed)
  05/31/2019  History of Present Illness: Susan Santos is a 18 y.o. female with history of symptomatic cholelithiasis.  She was initially seen in the ED on 3/16 and discharged home from the ED.  Follow up appointment on 4/1 was via video virtual visit due to COVID restrictions.  Her elective surgery had to be postponed due to COVID.  She presented again to the ED on the night of 6/23 with another episode of biliary colic.  After medication management, her pain improved and was discharged to home.  Her U/S showed cholelithiasis with likely 2 stones in the neck of the gallbladder, but no signs of cholecystitis.  She had a normal WBC of 8.5 and normal LFTs.  I have independently viewed her ultrasound and reviewed her labs and agree with findings.   Past Medical History: Past Medical History:  Diagnosis Date  . Gallstones   . Postpartum anemia 07/26/2017     Past Surgical History: Past Surgical History:  Procedure Laterality Date  . NO PAST SURGERIES      Home Medications: Prior to Admission medications   None    Allergies: No Known Allergies  Review of Systems: Review of Systems  Constitutional: Negative for chills and fever.  Respiratory: Negative for shortness of breath.   Cardiovascular: Negative for chest pain.  Gastrointestinal: Positive for abdominal pain and nausea. Negative for constipation, diarrhea and vomiting.    Physical Exam BP (!) 103/55   Pulse (!) 53   Temp 98.1 F (36.7 C)   Wt 124 lb (56.2 kg)   LMP 05/07/2019 (Exact Date)   SpO2 98%   BMI 22.68 kg/m  CONSTITUTIONAL: No acute distress HEENT:  Normocephalic, atraumatic, extraocular motion intact. RESPIRATORY:  Lungs are clear, and breath sounds are equal bilaterally. Normal respiratory effort without pathologic use of accessory muscles. CARDIOVASCULAR: Heart is regular without murmurs, gallops, or rubs. GI: The abdomen is soft, non-distended, non-tender to palpation.  Negative Murphy's  sign. NEUROLOGIC:  Motor and sensation is grossly normal.  Cranial nerves are grossly intact. PSYCH:  Alert and oriented to person, place and time. Affect is normal.  Labs/Imaging: Labs 05/30/19: Na 140, K 3.7, Cl 105, CO2 26, BUN 14, Cr 0.59.  Tbili 0.5, AST 15, ALT 13, AP 56, lipase 38.  WBC 8.5, Hgb 12.9, Hct 38.4, Plt 245  Assessment and Plan: This is a 18 y.o. female with symptomatic cholelithiasis.  Discussed with patient that given the recurrence of symptoms and increased severity, we should go ahead and schedule her for surgery.  She agrees.  She's scheduled for 7/1 for laparoscopic cholecystectomy.  Discussed with her the risks of bleeding, infection, and injury to surrounding structures, that this is an outpatient procedure, post-op restrictions and outcomes.  She's in agreement and wishes to proceed.  Also discussed with her that she would get tested for COVID as part of the preop arrangements.  Face-to-face time spent with the patient and care providers was 25 minutes, with more than 50% of the time spent counseling, educating, and coordinating care of the patient.     Susan Santos Susan Hollynn Garno, MD [facility]    

## 2019-05-31 NOTE — Progress Notes (Signed)
Patient's surgery to be scheduled for 06/06/19 at [facility] with Dr. Hampton Abbot.   The patient is aware to have COVID-19 testing done on 06/01/19 at the [facility] drive thru ([address] [place]) between 8:00 am and 10:30 am. She is aware to isolate after, have no visitors, wash hands frequently, and avoid touching face.   The patient is aware she will be contacted by the [place] to complete a phone interview sometime in the near future.  Patient aware to be NPO after midnight and have a driver.   She is aware to check in at the [place] entrance where he/she will be screened for the coronavirus and then sent to Same Day Surgery.   Patient aware that she may have no visitors and driver will need to wait in the car due to COVID-19 restrictions.   The patient verbalizes understanding of the above.   The patient is aware to call the office should she have further questions.

## 2019-05-31 NOTE — Progress Notes (Signed)
  05/31/2019  History of Present Illness: Susan Santos is a 18 y.o. female with history of symptomatic cholelithiasis.  She was initially seen in the ED on 3/16 and discharged home from the ED.  Follow up appointment on 4/1 was via video virtual visit due to COVID restrictions.  Her elective surgery had to be postponed due to COVID.  She presented again to the ED on the night of 6/23 with another episode of biliary colic.  After medication management, her pain improved and was discharged to home.  Her U/S showed cholelithiasis with likely 2 stones in the neck of the gallbladder, but no signs of cholecystitis.  She had a normal WBC of 8.5 and normal LFTs.  I have independently viewed her ultrasound and reviewed her labs and agree with findings.   Past Medical History: Past Medical History:  Diagnosis Date  . Gallstones   . Postpartum anemia 07/26/2017     Past Surgical History: Past Surgical History:  Procedure Laterality Date  . NO PAST SURGERIES      Home Medications: Prior to Admission medications   None    Allergies: No Known Allergies  Review of Systems: Review of Systems  Constitutional: Negative for chills and fever.  Respiratory: Negative for shortness of breath.   Cardiovascular: Negative for chest pain.  Gastrointestinal: Positive for abdominal pain and nausea. Negative for constipation, diarrhea and vomiting.    Physical Exam BP (!) 103/55   Pulse (!) 53   Temp 98.1 F (36.7 C)   Wt 124 lb (56.2 kg)   LMP 05/07/2019 (Exact Date)   SpO2 98%   BMI 22.68 kg/m  CONSTITUTIONAL: No acute distress HEENT:  Normocephalic, atraumatic, extraocular motion intact. RESPIRATORY:  Lungs are clear, and breath sounds are equal bilaterally. Normal respiratory effort without pathologic use of accessory muscles. CARDIOVASCULAR: Heart is regular without murmurs, gallops, or rubs. GI: The abdomen is soft, non-distended, non-tender to palpation.  Negative Murphy's  sign. NEUROLOGIC:  Motor and sensation is grossly normal.  Cranial nerves are grossly intact. PSYCH:  Alert and oriented to person, place and time. Affect is normal.  Labs/Imaging: Labs 05/30/19: Na 140, K 3.7, Cl 105, CO2 26, BUN 14, Cr 0.59.  Tbili 0.5, AST 15, ALT 13, AP 56, lipase 38.  WBC 8.5, Hgb 12.9, Hct 38.4, Plt 245  Assessment and Plan: This is a 18 y.o. female with symptomatic cholelithiasis.  Discussed with patient that given the recurrence of symptoms and increased severity, we should go ahead and schedule her for surgery.  She agrees.  She's scheduled for 7/1 for laparoscopic cholecystectomy.  Discussed with her the risks of bleeding, infection, and injury to surrounding structures, that this is an outpatient procedure, post-op restrictions and outcomes.  She's in agreement and wishes to proceed.  Also discussed with her that she would get tested for COVID as part of the preop arrangements.  Face-to-face time spent with the patient and care providers was 25 minutes, with more than 50% of the time spent counseling, educating, and coordinating care of the patient.     Melvyn Neth, [facility]

## 2019-06-01 ENCOUNTER — Other Ambulatory Visit: Payer: Self-pay

## 2019-06-01 ENCOUNTER — Other Ambulatory Visit
Admission: RE | Admit: 2019-06-01 | Discharge: 2019-06-01 | Disposition: A | Discharge status: Home / Self Care | Payer: Medicaid Other | Source: Ambulatory Visit | Attending: Surgery | Admitting: Surgery

## 2019-06-02 LAB — NOVEL CORONAVIRUS, NAA (HOSP ORDER, SEND-OUT TO REF LAB; TAT 18-24 HRS): SARS-CoV-2, NAA: DETECTED — AB

## 2019-06-04 ENCOUNTER — Telehealth: Payer: Self-pay

## 2019-06-04 NOTE — Progress Notes (Signed)
Patient had an exposure at work in early June. She had a Covid -19 test done at [facility] in [place] on 05/16/19 that was positive. She did contact the Health Department and did self isolate per their instructions.  Per Dr Hampton Abbot who contacted Infection Prevention,  the patient would need more than 21 days from the time of her test until when she may have surgery. Therefore we will need to reschedule her surgery scheduled with Dr Hampton Abbot for 06/06/19.  We do have documentation in hand for her Covid test and also her letter from the Health Department.

## 2019-06-04 NOTE — Telephone Encounter (Signed)
Call to patient to let her know that we need to reschedule her surgery as it needs to be more than 21 days from the time of her positive Covid test and surgery.   Patient's surgery to be rescheduled for 06/11/19 at [facility] with Dr. Hampton Abbot.  She will not need to have Covid-19 testing done again.   Patient aware to be NPO after midnight and have a driver.   She is aware to check in at the [place] entrance where she will be screened for the coronavirus and then sent to Same Day Surgery.   Patient aware that she may have no visitors and driver will need to wait in the car due to COVID-19 restrictions.   The patient verbalizes understanding of the above.   The patient is aware to call the office should she have further questions.

## 2019-06-07 NOTE — Pre-Procedure Instructions (Signed)
SPOKE WITH DR Kayleen Memos REGARDING PT WHO TESTED POSITIVE AT URGENT CARE BEGINNING OF June AND CAME THRU PAT TO BE COVID TESTED AND WE WERE UNAWARE THAT PT HAD PREVIOUSLY BEEN TESTED AND WAS POSITIVE. HER COVID TEST WITH [place] WAS STILL POSITIVE. I CALLED PT AND ASKED IF SHE HAD QUARANTINED FOR THE 14 AS INSTRUCTED BY HEALTH DEPT. SHE SAID YES SHE HAD AND SHE WAS ASYMPTOMATIC. I INFORMED DR Kayleen Memos OF THIS AND HE SAID SHE IS OK TO PROCEED WITH SURGERY 06-11-19

## 2019-06-11 ENCOUNTER — Encounter
Admission: RE | Disposition: A | Discharge status: Home / Self Care | Payer: Self-pay | Source: Home / Self Care | Attending: Surgery

## 2019-06-11 ENCOUNTER — Other Ambulatory Visit: Payer: Self-pay

## 2019-06-11 ENCOUNTER — Encounter: Payer: Self-pay | Admitting: *Deleted

## 2019-06-11 ENCOUNTER — Ambulatory Visit
Admission: RE | Admit: 2019-06-11 | Discharge: 2019-06-11 | Disposition: A | Discharge status: Home / Self Care | Payer: Self-pay | Attending: Surgery | Admitting: Surgery

## 2019-06-11 ENCOUNTER — Ambulatory Visit: Payer: Self-pay | Admitting: Anesthesiology

## 2019-06-11 DIAGNOSIS — K8020 Calculus of gallbladder without cholecystitis without obstruction: Secondary | ICD-10-CM

## 2019-06-11 DIAGNOSIS — K8010 Calculus of gallbladder with chronic cholecystitis without obstruction: Secondary | ICD-10-CM | POA: Insufficient documentation

## 2019-06-11 HISTORY — PX: CHOLECYSTECTOMY: SHX55

## 2019-06-11 LAB — POCT PREGNANCY, URINE: Preg Test, Ur: NEGATIVE

## 2019-06-11 SURGERY — LAPAROSCOPIC CHOLECYSTECTOMY
Anesthesia: General

## 2019-06-11 MED ORDER — FENTANYL CITRATE (PF) 100 MCG/2ML IJ SOLN
INTRAMUSCULAR | Status: DC | PRN
  Administered 2019-06-11 (×2): 50 ug via INTRAVENOUS
  Administered 2019-06-11: 100 ug via INTRAVENOUS

## 2019-06-11 MED ORDER — FENTANYL CITRATE (PF) 100 MCG/2ML IJ SOLN
INTRAMUSCULAR | Status: AC
  Administered 2019-06-11: 50 ug via INTRAVENOUS
  Filled 2019-06-11: qty 2

## 2019-06-11 MED ORDER — FAMOTIDINE 20 MG PO TABS
ORAL_TABLET | ORAL | Status: AC
  Administered 2019-06-11: 08:00:00 20 mg via ORAL
  Filled 2019-06-11: qty 1

## 2019-06-11 MED ORDER — OXYCODONE HCL 5 MG PO TABS: 5.0000 mg | ORAL_TABLET | ORAL | 0 refills | Status: DC | PRN

## 2019-06-11 MED ORDER — CEFAZOLIN SODIUM-DEXTROSE 2-4 GM/100ML-% IV SOLN
2.0000 g | INTRAVENOUS | Status: AC
  Administered 2019-06-11: 2 g via INTRAVENOUS

## 2019-06-11 MED ORDER — FENTANYL CITRATE (PF) 100 MCG/2ML IJ SOLN
25.0000 ug | INTRAMUSCULAR | Status: DC | PRN
  Administered 2019-06-11 (×2): 50 ug via INTRAVENOUS

## 2019-06-11 MED ORDER — GABAPENTIN 300 MG PO CAPS
ORAL_CAPSULE | ORAL | Status: AC
  Administered 2019-06-11: 08:00:00 300 mg via ORAL
  Filled 2019-06-11: qty 1

## 2019-06-11 MED ORDER — GABAPENTIN 300 MG PO CAPS
300.0000 mg | ORAL_CAPSULE | ORAL | Status: AC
  Administered 2019-06-11: 300 mg via ORAL

## 2019-06-11 MED ORDER — ONDANSETRON HCL 4 MG/2ML IJ SOLN
INTRAMUSCULAR | Status: AC
  Filled 2019-06-11: qty 2

## 2019-06-11 MED ORDER — MIDAZOLAM HCL 2 MG/2ML IJ SOLN
INTRAMUSCULAR | Status: AC
  Filled 2019-06-11: qty 2

## 2019-06-11 MED ORDER — OXYCODONE HCL 5 MG/5ML PO SOLN: 5.0000 mg | Freq: Once | ORAL | Status: DC | PRN

## 2019-06-11 MED ORDER — DEXAMETHASONE SODIUM PHOSPHATE 10 MG/ML IJ SOLN
INTRAMUSCULAR | Status: DC | PRN
  Administered 2019-06-11: 10 mg via INTRAVENOUS

## 2019-06-11 MED ORDER — OXYCODONE HCL 5 MG PO TABS: 5.0000 mg | ORAL_TABLET | Freq: Once | ORAL | Status: DC | PRN

## 2019-06-11 MED ORDER — LIDOCAINE HCL (CARDIAC) PF 100 MG/5ML IV SOSY
PREFILLED_SYRINGE | INTRAVENOUS | Status: DC | PRN
  Administered 2019-06-11: 60 mg via INTRAVENOUS

## 2019-06-11 MED ORDER — SUCCINYLCHOLINE CHLORIDE 20 MG/ML IJ SOLN
INTRAMUSCULAR | Status: DC | PRN
  Administered 2019-06-11: 80 mg via INTRAVENOUS

## 2019-06-11 MED ORDER — MIDAZOLAM HCL 2 MG/2ML IJ SOLN
INTRAMUSCULAR | Status: DC | PRN
  Administered 2019-06-11: 2 mg via INTRAVENOUS

## 2019-06-11 MED ORDER — ACETAMINOPHEN 500 MG PO TABS
ORAL_TABLET | ORAL | Status: AC
  Administered 2019-06-11: 08:00:00 1000 mg via ORAL
  Filled 2019-06-11: qty 2

## 2019-06-11 MED ORDER — BUPIVACAINE-EPINEPHRINE (PF) 0.25% -1:200000 IJ SOLN
INTRAMUSCULAR | Status: DC | PRN
  Administered 2019-06-11: 30 mL

## 2019-06-11 MED ORDER — KETOROLAC TROMETHAMINE 30 MG/ML IJ SOLN
INTRAMUSCULAR | Status: DC | PRN
  Administered 2019-06-11: 30 mg via INTRAVENOUS

## 2019-06-11 MED ORDER — GLYCOPYRROLATE 0.2 MG/ML IJ SOLN
INTRAMUSCULAR | Status: DC | PRN
  Administered 2019-06-11: 0.2 mg via INTRAVENOUS

## 2019-06-11 MED ORDER — OXYCODONE HCL 5 MG PO TABS
5.0000 mg | ORAL_TABLET | ORAL | Status: DC | PRN
  Administered 2019-06-11: 5 mg via ORAL

## 2019-06-11 MED ORDER — PROPOFOL 10 MG/ML IV BOLUS
INTRAVENOUS | Status: DC | PRN
  Administered 2019-06-11: 30 mg via INTRAVENOUS
  Administered 2019-06-11: 130 mg via INTRAVENOUS

## 2019-06-11 MED ORDER — SUGAMMADEX SODIUM 200 MG/2ML IV SOLN
INTRAVENOUS | Status: AC
  Filled 2019-06-11: qty 2

## 2019-06-11 MED ORDER — KETOROLAC TROMETHAMINE 30 MG/ML IJ SOLN
INTRAMUSCULAR | Status: AC
  Filled 2019-06-11: qty 1

## 2019-06-11 MED ORDER — FENTANYL CITRATE (PF) 100 MCG/2ML IJ SOLN
INTRAMUSCULAR | Status: AC
  Filled 2019-06-11: qty 2

## 2019-06-11 MED ORDER — OXYCODONE HCL 5 MG PO TABS
ORAL_TABLET | ORAL | Status: AC
  Filled 2019-06-11: qty 1

## 2019-06-11 MED ORDER — GLYCOPYRROLATE 0.2 MG/ML IJ SOLN
INTRAMUSCULAR | Status: AC
  Filled 2019-06-11: qty 1

## 2019-06-11 MED ORDER — IBUPROFEN 600 MG PO TABS: 600.0000 mg | ORAL_TABLET | Freq: Three times a day (TID) | ORAL | 0 refills | Status: DC | PRN

## 2019-06-11 MED ORDER — CHLORHEXIDINE GLUCONATE CLOTH 2 % EX PADS: 6.0000 | MEDICATED_PAD | Freq: Once | CUTANEOUS | Status: DC

## 2019-06-11 MED ORDER — DEXAMETHASONE SODIUM PHOSPHATE 10 MG/ML IJ SOLN
INTRAMUSCULAR | Status: AC
  Filled 2019-06-11: qty 1

## 2019-06-11 MED ORDER — ACETAMINOPHEN 500 MG PO TABS
1000.0000 mg | ORAL_TABLET | ORAL | Status: AC
  Administered 2019-06-11: 1000 mg via ORAL

## 2019-06-11 MED ORDER — PROPOFOL 10 MG/ML IV BOLUS
INTRAVENOUS | Status: AC
  Filled 2019-06-11: qty 20

## 2019-06-11 MED ORDER — LIDOCAINE HCL (PF) 2 % IJ SOLN
INTRAMUSCULAR | Status: AC
  Filled 2019-06-11: qty 10

## 2019-06-11 MED ORDER — SUGAMMADEX SODIUM 200 MG/2ML IV SOLN
INTRAVENOUS | Status: DC | PRN
  Administered 2019-06-11: 120 mg via INTRAVENOUS

## 2019-06-11 MED ORDER — ONDANSETRON HCL 4 MG/2ML IJ SOLN
INTRAMUSCULAR | Status: DC | PRN
  Administered 2019-06-11: 4 mg via INTRAVENOUS

## 2019-06-11 MED ORDER — ROCURONIUM BROMIDE 100 MG/10ML IV SOLN
INTRAVENOUS | Status: DC | PRN
  Administered 2019-06-11: 10 mg via INTRAVENOUS
  Administered 2019-06-11: 30 mg via INTRAVENOUS

## 2019-06-11 MED ORDER — FAMOTIDINE 20 MG PO TABS
20.0000 mg | ORAL_TABLET | Freq: Once | ORAL | Status: AC
  Administered 2019-06-11: 20 mg via ORAL

## 2019-06-11 MED ORDER — CEFAZOLIN SODIUM-DEXTROSE 2-4 GM/100ML-% IV SOLN
INTRAVENOUS | Status: AC
  Filled 2019-06-11: qty 100

## 2019-06-11 MED ORDER — ROCURONIUM BROMIDE 50 MG/5ML IV SOLN
INTRAVENOUS | Status: AC
  Filled 2019-06-11: qty 1

## 2019-06-11 MED ORDER — SUCCINYLCHOLINE CHLORIDE 20 MG/ML IJ SOLN
INTRAMUSCULAR | Status: AC
  Filled 2019-06-11: qty 1

## 2019-06-11 MED ORDER — LACTATED RINGERS IV SOLN
INTRAVENOUS | Status: DC
  Administered 2019-06-11: 50 mL/h via INTRAVENOUS

## 2019-06-11 SURGICAL SUPPLY — 42 items
APPLIER CLIP 5 13 M/L LIGAMAX5 (MISCELLANEOUS) ×3
BLADE SURG 15 STRL LF DISP TIS (BLADE) ×1 IMPLANT
BLADE SURG 15 STRL SS (BLADE) ×2
CANISTER SUCT 1200ML W/VALVE (MISCELLANEOUS) ×3 IMPLANT
CATH CHOLANGI 4FR 420404F (CATHETERS) IMPLANT
CHLORAPREP W/TINT 26 (MISCELLANEOUS) ×3 IMPLANT
CLIP APPLIE 5 13 M/L LIGAMAX5 (MISCELLANEOUS) ×1 IMPLANT
CONRAY 60ML FOR OR (MISCELLANEOUS) IMPLANT
COVER WAND RF STERILE (DRAPES) ×3 IMPLANT
DERMABOND ADVANCED (GAUZE/BANDAGES/DRESSINGS) ×2
DERMABOND ADVANCED .7 DNX12 (GAUZE/BANDAGES/DRESSINGS) ×1 IMPLANT
DRAPE C-ARM XRAY 36X54 (DRAPES) IMPLANT
ELECT REM PT RETURN 9FT ADLT (ELECTROSURGICAL) ×3
ELECTRODE REM PT RTRN 9FT ADLT (ELECTROSURGICAL) ×1 IMPLANT
GLOVE SURG SYN 7.0 (GLOVE) ×3 IMPLANT
GLOVE SURG SYN 7.5  E (GLOVE) ×2
GLOVE SURG SYN 7.5 E (GLOVE) ×1 IMPLANT
GOWN STRL REUS W/ TWL LRG LVL3 (GOWN DISPOSABLE) ×5 IMPLANT
GOWN STRL REUS W/TWL LRG LVL3 (GOWN DISPOSABLE) ×10
IRRIGATION STRYKERFLOW (MISCELLANEOUS) IMPLANT
IRRIGATOR STRYKERFLOW (MISCELLANEOUS)
IV CATH ANGIO 12GX3 LT BLUE (NEEDLE) ×3 IMPLANT
IV NS 1000ML (IV SOLUTION)
IV NS 1000ML BAXH (IV SOLUTION) IMPLANT
JACKSON PRATT 10 (INSTRUMENTS) IMPLANT
L-HOOK LAP DISP 36CM (ELECTROSURGICAL) ×3
LABEL OR SOLS (LABEL) ×3 IMPLANT
LHOOK LAP DISP 36CM (ELECTROSURGICAL) ×1 IMPLANT
NEEDLE HYPO 22GX1.5 SAFETY (NEEDLE) ×6 IMPLANT
PACK LAP CHOLECYSTECTOMY (MISCELLANEOUS) ×3 IMPLANT
PENCIL ELECTRO HAND CTR (MISCELLANEOUS) ×3 IMPLANT
POUCH SPECIMEN RETRIEVAL 10MM (ENDOMECHANICALS) ×3 IMPLANT
SCISSORS METZENBAUM CVD 33 (INSTRUMENTS) ×3 IMPLANT
SET TUBE SMOKE EVAC HIGH FLOW (TUBING) ×3 IMPLANT
SLEEVE ADV FIXATION 5X100MM (TROCAR) ×9 IMPLANT
SPONGE VERSALON 4X4 4PLY (MISCELLANEOUS) IMPLANT
SUT MNCRL 4-0 (SUTURE) ×4
SUT MNCRL 4-0 27XMFL (SUTURE) ×2
SUT VICRYL 0 AB UR-6 (SUTURE) ×3 IMPLANT
SUTURE MNCRL 4-0 27XMF (SUTURE) ×2 IMPLANT
TROCAR BALLN GELPORT 12X130M (ENDOMECHANICALS) ×3 IMPLANT
TROCAR Z-THREAD OPTICAL 5X100M (TROCAR) ×3 IMPLANT

## 2019-06-11 NOTE — Anesthesia Preprocedure Evaluation (Signed)
Anesthesia Evaluation  Patient identified by MRN, date of birth, ID band Patient awake    Reviewed: Allergy & Precautions, H&P , NPO status , Patient's Chart, lab work & pertinent test results  History of Anesthesia Complications Negative for: history of anesthetic complications  Airway Mallampati: II  TM Distance: >3 FB Neck ROM: full    Dental  (+) Chipped   Pulmonary neg shortness of breath, Recent URI , Resolved,           Cardiovascular Exercise Tolerance: Good (-) angina(-) Past MI and (-) DOE negative cardio ROS       Neuro/Psych negative neurological ROS  negative psych ROS   GI/Hepatic negative GI ROS, Neg liver ROS, neg GERD  ,  Endo/Other  negative endocrine ROS  Renal/GU      Musculoskeletal   Abdominal   Peds  Hematology negative hematology ROS (+)   Anesthesia Other Findings Past Medical History: No date: Gallstones 07/26/2017: Postpartum anemia  Past Surgical History: No date: NO PAST SURGERIES  BMI    Body Mass Index: 22.66 kg/m      Reproductive/Obstetrics negative OB ROS                             Anesthesia Physical Anesthesia Plan  ASA: I  Anesthesia Plan: General ETT and Rapid Sequence   Post-op Pain Management:    Induction: Intravenous  PONV Risk Score and Plan: Ondansetron, Dexamethasone, Midazolam and Treatment may vary due to age or medical condition  Airway Management Planned: Oral ETT  Additional Equipment:   Intra-op Plan:   Post-operative Plan: Extubation in OR  Informed Consent: I have reviewed the patients History and Physical, chart, labs and discussed the procedure including the risks, benefits and alternatives for the proposed anesthesia with the patient or authorized representative who has indicated his/her understanding and acceptance.     Dental Advisory Given  Plan Discussed with: Anesthesiologist, CRNA and  Surgeon  Anesthesia Plan Comments: (Patient initially tested positive for COVID on June 10  Anesthesia team wore N95 and face shields during all of our interactions with this patient  Patient consented for risks of anesthesia including but not limited to:  - adverse reactions to medications - damage to teeth, lips or other oral mucosa - sore throat or hoarseness - Damage to heart, brain, lungs or loss of life  Patient voiced understanding.)        Anesthesia Quick Evaluation

## 2019-06-11 NOTE — Op Note (Signed)
  Procedure Date:  06/11/2019  Pre-operative Diagnosis:  Symptomatic cholelithiasis  Post-operative Diagnosis:  Symptomatic cholelithiasis  Procedure:  Laparoscopic cholecystectomy  Surgeon:  Melvyn Neth, MD  Anesthesia:  General endotracheal  Estimated Blood Loss:  5 ml  Specimens:  gallbladder  Complications:  None  Indications for Procedure:  This is a 18 y.o. female who presents with abdominal pain and workup revealing symptomatic cholelithiasis.  The benefits, complications, treatment options, and expected outcomes were discussed with the patient. The risks of bleeding, infection, recurrence of symptoms, failure to resolve symptoms, bile duct damage, bile duct leak, retained common bile duct stone, bowel injury, and need for further procedures were all discussed with the patient and she was willing to proceed.  Description of Procedure: The patient was correctly identified in the preoperative area and brought into the operating room.  The patient was placed supine with VTE prophylaxis in place.  Appropriate time-outs were performed.  Anesthesia was induced and the patient was intubated.  Appropriate antibiotics were infused.  The abdomen was prepped and draped in a sterile fashion. An infraumbilical incision was made. A cutdown technique was used to enter the abdominal cavity without injury, and a Hasson trocar was inserted.  Pneumoperitoneum was obtained with appropriate opening pressures.  A 5-mm port was placed in the subxiphoid area and two 5-mm ports were placed in the right upper quadrant under direct visualization.  The gallbladder was identified.  The fundus was grasped and retracted cephalad.  Adhesions were lysed bluntly and with electrocautery. The infundibulum was grasped and retracted laterally, exposing the peritoneum overlying the gallbladder.  This was incised with electrocautery and extended on either side of the gallbladder.  The cystic duct and cystic artery were  clearly identified and bluntly dissected.  Both were clipped twice proximally and once distally, cutting in between.  The gallbladder was taken from the gallbladder fossa in a retrograde fashion with electrocautery. The gallbladder was placed in an Endocatch bag. The liver bed was inspected and any bleeding was controlled with electrocautery. The right upper quadrant was then inspected again revealing intact clips, no bleeding, and no ductal injury.  The 5 mm ports were removed under direct visualization and the Hasson trocar was removed.  The Endocatch bag was brought out via the umbilical incision. The fascial opening was closed using 0 vicryl suture.  Local anesthetic was infused in all incisions and the incisions were closed with 4-0 Monocryl.  The wounds were cleaned and sealed with DermaBond.  The patient was emerged from anesthesia and extubated and brought to the recovery room for further management.  The patient tolerated the procedure well and all counts were correct at the end of the case.   Melvyn Neth, MD

## 2019-06-11 NOTE — Anesthesia Procedure Notes (Signed)
Procedure Name: Intubation Date/Time: 06/11/2019 8:55 AM Performed by: Jonna Clark, CRNA Pre-anesthesia Checklist: Patient identified, Patient being monitored, Timeout performed, Emergency Drugs available and Suction available Patient Re-evaluated:Patient Re-evaluated prior to induction Oxygen Delivery Method: Circle system utilized Preoxygenation: Pre-oxygenation with 100% oxygen Induction Type: IV induction Ventilation: Mask ventilation without difficulty Laryngoscope Size: Mac and 3 Grade View: Grade I Tube type: Oral Tube size: 6.5 mm Number of attempts: 1 Airway Equipment and Method: Stylet Placement Confirmation: ETT inserted through vocal cords under direct vision,  positive ETCO2 and breath sounds checked- equal and bilateral Secured at: 20 cm Tube secured with: Tape Dental Injury: Teeth and Oropharynx as per pre-operative assessment

## 2019-06-11 NOTE — Transfer of Care (Signed)
Immediate Anesthesia Transfer of Care Note  Patient: Susan Santos  Procedure(s) Performed: LAPAROSCOPIC CHOLECYSTECTOMY (N/A )  Patient Location: PACU  Anesthesia Type:General  Level of Consciousness: drowsy and patient cooperative  Airway & Oxygen Therapy: Patient Spontanous Breathing and Patient connected to face mask oxygen  Post-op Assessment: Report given to RN and Post -op Vital signs reviewed and stable  Post vital signs: Reviewed and stable  Last Vitals:  Vitals Value Taken Time  BP 92/59 06/11/19 1021  Temp 36 C 06/11/19 1020  Pulse 66 06/11/19 1020  Resp 15 06/11/19 1021  SpO2 100 % 06/11/19 1020    Last Pain:  Vitals:   06/11/19 0821  TempSrc: Oral  PainSc: 1       Patients Stated Pain Goal: 0 (45/85/92 [id number])  Complications: No apparent anesthesia complications

## 2019-06-11 NOTE — Discharge Instructions (Signed)

## 2019-06-11 NOTE — Anesthesia Post-op Follow-up Note (Signed)
Anesthesia QCDR form completed.        

## 2019-06-11 NOTE — Anesthesia Postprocedure Evaluation (Signed)
Anesthesia Post Note  Patient: Susan Santos  Procedure(s) Performed: LAPAROSCOPIC CHOLECYSTECTOMY (N/A )  Patient location during evaluation: PACU Anesthesia Type: General Level of consciousness: awake and alert Pain management: pain level controlled Vital Signs Assessment: post-procedure vital signs reviewed and stable Respiratory status: spontaneous breathing, nonlabored ventilation, respiratory function stable and patient connected to nasal cannula oxygen Cardiovascular status: blood pressure returned to baseline and stable Postop Assessment: no apparent nausea or vomiting Anesthetic complications: no     Last Vitals:  Vitals:   06/11/19 1123 06/11/19 1231  BP: 100/70 (!) 102/51  Pulse: (!) 58 60  Resp: 14 16  Temp:  36.5 C  SpO2: 100% 100%    Last Pain:  Vitals:   06/11/19 1231  TempSrc: Oral  PainSc: 3                  Precious Haws Piscitello

## 2019-06-11 NOTE — Interval H&P Note (Signed)
History and Physical Interval Note:  06/11/2019 10:13 AM  Susan Santos  has presented today for surgery, with the diagnosis of Q20.60 BILIARY COLIC.  The various methods of treatment have been discussed with the patient and family. After consideration of risks, benefits and other options for treatment, the patient has consented to  Procedure(s): LAPAROSCOPIC CHOLECYSTECTOMY (N/A) as a surgical intervention.  The patient's history has been reviewed, patient examined, no change in status, stable for surgery.  I have reviewed the patient's chart and labs.  Questions were answered to the patient's satisfaction.     Lin Glazier

## 2019-06-12 LAB — SURGICAL PATHOLOGY

## 2019-06-15 ENCOUNTER — Ambulatory Visit: Payer: Self-pay | Admitting: Surgery

## 2019-06-18 ENCOUNTER — Telehealth: Payer: Self-pay | Admitting: Surgery

## 2019-06-18 NOTE — Telephone Encounter (Signed)
Patient is calling back again said she has a missed call. Please call patient.

## 2019-06-18 NOTE — Telephone Encounter (Signed)
Please call patient on [phone]

## 2019-06-18 NOTE — Telephone Encounter (Signed)
Tried to reach patient and the line is busy , called the line 3 times.

## 2019-06-18 NOTE — Telephone Encounter (Signed)
Left message for patient to call us [phone]

## 2019-06-18 NOTE — Telephone Encounter (Signed)
Left message for patient to call office.  

## 2019-06-18 NOTE — Telephone Encounter (Signed)
Patient is calling and has some questions about her insicion patient said it has dark yellow/green look to them. Please call patient and advise.

## 2019-06-27 ENCOUNTER — Encounter: Payer: Self-pay | Admitting: Surgery

## 2019-10-25 ENCOUNTER — Other Ambulatory Visit: Payer: Self-pay

## 2019-10-25 ENCOUNTER — Ambulatory Visit (LOCAL_COMMUNITY_HEALTH_CENTER): Payer: Self-pay

## 2019-10-25 VITALS — BP 105/55 | Ht 62.0 in | Wt 124.0 lb

## 2019-10-25 DIAGNOSIS — Z3201 Encounter for pregnancy test, result positive: Secondary | ICD-10-CM

## 2019-10-25 LAB — PREGNANCY, URINE: Preg Test, Ur: POSITIVE — AB

## 2019-10-25 MED ORDER — PRENATAL VITAMIN 27-0.8 MG PO TABS: 1.0000 | ORAL_TABLET | Freq: Every day | ORAL | 0 refills | Status: AC

## 2019-10-25 NOTE — Progress Notes (Signed)
Pt plans prenatal care at [facility]; sent to preadmit.

## 2019-10-30 ENCOUNTER — Encounter: Payer: Self-pay | Admitting: Emergency Medicine

## 2019-10-30 ENCOUNTER — Emergency Department: Payer: Medicaid Other

## 2019-10-30 ENCOUNTER — Other Ambulatory Visit: Payer: Self-pay

## 2019-10-30 ENCOUNTER — Emergency Department
Admission: EM | Admit: 2019-10-30 | Discharge: 2019-10-30 | Disposition: A | Discharge status: Home / Self Care | Payer: Medicaid Other | Attending: Student in an Organized Health Care Education/Training Program | Admitting: Student in an Organized Health Care Education/Training Program

## 2019-10-30 DIAGNOSIS — R102 Pelvic and perineal pain: Secondary | ICD-10-CM | POA: Insufficient documentation

## 2019-10-30 DIAGNOSIS — O26891 Other specified pregnancy related conditions, first trimester: Secondary | ICD-10-CM | POA: Insufficient documentation

## 2019-10-30 DIAGNOSIS — Z79899 Other long term (current) drug therapy: Secondary | ICD-10-CM | POA: Insufficient documentation

## 2019-10-30 DIAGNOSIS — Z3A01 Less than 8 weeks gestation of pregnancy: Secondary | ICD-10-CM | POA: Insufficient documentation

## 2019-10-30 DIAGNOSIS — O26899 Other specified pregnancy related conditions, unspecified trimester: Secondary | ICD-10-CM

## 2019-10-30 LAB — ABO/RH: ABO/RH(D): O POS

## 2019-10-30 LAB — COMPREHENSIVE METABOLIC PANEL
ALT: 9 U/L (ref 0–44)
AST: 14 U/L — ABNORMAL LOW (ref 15–41)
Albumin: 3.9 g/dL (ref 3.5–5.0)
Alkaline Phosphatase: 36 U/L — ABNORMAL LOW (ref 38–126)
Anion gap: 10 (ref 5–15)
BUN: 8 mg/dL (ref 6–20)
CO2: 22 mmol/L (ref 22–32)
Calcium: 8.9 mg/dL (ref 8.9–10.3)
Chloride: 103 mmol/L (ref 98–111)
Creatinine, Ser: 0.55 mg/dL (ref 0.44–1.00)
GFR calc Af Amer: >60 mL/min (ref >60)
GFR calc non Af Amer: >60 mL/min (ref >60)
Glucose, Bld: 89 mg/dL (ref 70–99)
Potassium: 3.6 mmol/L (ref 3.5–5.1)
Sodium: 135 mmol/L (ref 135–145)
Total Bilirubin: 0.8 mg/dL (ref 0.3–1.2)
Total Protein: 6.9 g/dL (ref 6.5–8.1)

## 2019-10-30 LAB — URINALYSIS, ROUTINE W REFLEX MICROSCOPIC
Bacteria, UA: NONE SEEN
Bilirubin Urine: NEGATIVE
Glucose, UA: NEGATIVE mg/dL
Hgb urine dipstick: NEGATIVE
Ketones, ur: NEGATIVE mg/dL
Nitrite: NEGATIVE
Protein, ur: NEGATIVE mg/dL
Specific Gravity, Urine: 1.021 (ref 1.005–1.030)
pH: 5 (ref 5.0–8.0)

## 2019-10-30 LAB — CBC WITH DIFFERENTIAL/PLATELET
Abs Immature Granulocytes: 0.02 10*3/uL (ref 0.00–0.07)
Basophils Absolute: 0 10*3/uL (ref 0.0–0.1)
Basophils Relative: 0 %
Eosinophils Absolute: 0 10*3/uL (ref 0.0–0.5)
Eosinophils Relative: 0 %
HCT: 35.1 % — ABNORMAL LOW (ref 36.0–46.0)
Hemoglobin: 12.4 g/dL (ref 12.0–15.0)
Immature Granulocytes: 0 %
Lymphocytes Relative: 24 %
Lymphs Abs: 2.1 10*3/uL (ref 0.7–4.0)
MCH: 30.5 pg (ref 26.0–34.0)
MCHC: 35.3 g/dL (ref 30.0–36.0)
MCV: 86.2 fL (ref 80.0–100.0)
Monocytes Absolute: 0.4 10*3/uL (ref 0.1–1.0)
Monocytes Relative: 5 %
Neutro Abs: 5.9 10*3/uL (ref 1.7–7.7)
Neutrophils Relative %: 71 %
Platelets: 198 10*3/uL (ref 150–400)
RBC: 4.07 MIL/uL (ref 3.87–5.11)
RDW: 11.9 % (ref 11.5–15.5)
WBC: 8.5 10*3/uL (ref 4.0–10.5)
nRBC: 0 % (ref 0.0–0.2)

## 2019-10-30 LAB — HCG, QUANTITATIVE, PREGNANCY: hCG, Beta Chain, Quant, S: 43921 m[IU]/mL — ABNORMAL HIGH (ref <5)

## 2019-10-30 LAB — POCT PREGNANCY, URINE: Preg Test, Ur: POSITIVE — AB

## 2019-10-30 LAB — LIPASE, BLOOD: Lipase: 23 U/L (ref 11–51)

## 2019-10-30 MED ORDER — ACETAMINOPHEN 500 MG PO TABS
1000.0000 mg | ORAL_TABLET | Freq: Once | ORAL | Status: AC
  Administered 2019-10-30: 18:00:00 1000 mg via ORAL
  Filled 2019-10-30: qty 2

## 2019-10-30 MED ORDER — POLYETHYLENE GLYCOL 3350 17 G PO PACK: 17.0000 g | PACK | Freq: Every day | ORAL | 0 refills | Status: DC

## 2019-10-30 NOTE — ED Triage Notes (Signed)
Pt here with c/o abdominal cramping/pelvic pain for the past week, states pain is constant. She is [redacted] weeks pregnant with her second baby, did not have any issues with the first. Denies bleeding. NAD.

## 2019-10-30 NOTE — ED Provider Notes (Signed)
[facility] Emergency Department Provider Note    First MD Initiated Contact with Patient 10/30/19 1634     (approximate)  I have reviewed the triage vital signs and the nursing notes.   HISTORY  Chief Complaint Abdominal Pain    HPI Susan Santos is a 18 y.o. female who presents to the ER for pelvic discomfort in early pregnancy.  States she is having pelvic cramping.  Is not constant.  Will last 10 to 15 minutes.  Mild in nature.  This is primarily on the right side. But also suprapubic. No radiation to flank or back.  Does endorse h?o constipation. Currently pain-free.  Is she is is in early pregnancy wanted to be evaluated.  She is denying any vaginal discharge or vaginal bleeding.  No syncopal episodes or fainting spells.  Denies any fevers.  Past Medical History:  Diagnosis Date  . Gallstones   . Postpartum anemia 07/26/2017  . Vaginal delivery 2018   girl   Family History  Problem Relation Age of Onset  . Diabetes Mother    Past Surgical History:  Procedure Laterality Date  . CHOLECYSTECTOMY N/A 06/11/2019   Procedure: LAPAROSCOPIC CHOLECYSTECTOMY;  Surgeon: Olean Ree, MD;  Location: [facility] ORS;  Service: General;  Laterality: N/A;  . NO PAST SURGERIES     Patient Active Problem List   Diagnosis Date Noted  . Symptomatic cholelithiasis 03/07/2019  . Postpartum anemia 07/26/2017      Prior to Admission medications   Medication Sig Start Date End Date Taking? Authorizing Provider  Prenatal Vit-Fe Fumarate-FA (PRENATAL VITAMIN) 27-0.8 MG TABS Take 1 tablet by mouth daily. 10/25/19 02/02/20  Caren Macadam, MD    Allergies Patient has no known allergies.    Social History Social History   Tobacco Use  . Smoking status: Never Smoker  . Smokeless tobacco: Never Used  Substance Use Topics  . Alcohol use: No  . Drug use: No    Review of Systems Patient denies headaches, rhinorrhea, blurry vision, numbness, shortness of  breath, chest pain, edema, cough, abdominal pain, nausea, vomiting, diarrhea, dysuria, fevers, rashes or hallucinations unless otherwise stated above in HPI. ____________________________________________   PHYSICAL EXAM:  VITAL SIGNS: Vitals:   10/30/19 1527  BP: (!) 104/43  Pulse: 74  Resp: 19  Temp: 98.8 F (37.1 C)  SpO2: 100%    Constitutional: Alert and oriented.  Eyes: Conjunctivae are normal.  Head: Atraumatic. Nose: No congestion/rhinnorhea. Mouth/Throat: Mucous membranes are moist.   Neck: No stridor. Painless ROM.  Cardiovascular: Normal rate, regular rhythm. Grossly normal heart sounds.  Good peripheral circulation. Respiratory: Normal respiratory effort.  No retractions. Lungs CTAB. Gastrointestinal: Soft and nontender in all four quadrants. No distention. No abdominal bruits. No CVA tenderness. Genitourinary:  Musculoskeletal: No lower extremity tenderness nor edema.  No joint effusions. Neurologic:  Normal speech and language. No gross focal neurologic deficits are appreciated. No facial droop Skin:  Skin is warm, dry and intact. No rash noted. Psychiatric: Mood and affect are normal. Speech and behavior are normal.  ____________________________________________   LABS (all labs ordered are listed, but only abnormal results are displayed)  Results for orders placed or performed during the hospital encounter of 10/30/19 (from the past 24 hour(s))  Pregnancy, urine POC     Status: Abnormal   Collection Time: 10/30/19  3:50 PM  Result Value Ref Range   Preg Test, Ur POSITIVE (A) NEGATIVE  Urinalysis, Routine w reflex microscopic  Status: Abnormal   Collection Time: 10/30/19  3:57 PM  Result Value Ref Range   Color, Urine YELLOW (A) YELLOW   APPearance CLOUDY (A) CLEAR   Specific Gravity, Urine 1.021 1.005 - 1.030   pH 5.0 5.0 - 8.0   Glucose, UA NEGATIVE NEGATIVE mg/dL   Hgb urine dipstick NEGATIVE NEGATIVE   Bilirubin Urine NEGATIVE NEGATIVE   Ketones,  ur NEGATIVE NEGATIVE mg/dL   Protein, ur NEGATIVE NEGATIVE mg/dL   Nitrite NEGATIVE NEGATIVE   Leukocytes,Ua LARGE (A) NEGATIVE   RBC / HPF 21-50 0 - 5 RBC/hpf   WBC, UA 11-20 0 - 5 WBC/hpf   Bacteria, UA NONE SEEN NONE SEEN   Squamous Epithelial / LPF 21-50 0 - 5   Mucus PRESENT   CBC with Differential     Status: Abnormal   Collection Time: 10/30/19  6:14 PM  Result Value Ref Range   WBC 8.5 4.0 - 10.5 K/uL   RBC 4.07 3.87 - 5.11 MIL/uL   Hemoglobin 12.4 12.0 - 15.0 g/dL   HCT 81.135.1 (L) 91.436.0 - 78.246.0 %   MCV 86.2 80.0 - 100.0 fL   MCH 30.5 26.0 - 34.0 pg   MCHC 35.3 30.0 - 36.0 g/dL   RDW 95.611.9 21.311.5 - 08.615.5 %   Platelets 198 150 - 400 K/uL   nRBC 0.0 0.0 - 0.2 %   Neutrophils Relative % 71 %   Neutro Abs 5.9 1.7 - 7.7 K/uL   Lymphocytes Relative 24 %   Lymphs Abs 2.1 0.7 - 4.0 K/uL   Monocytes Relative 5 %   Monocytes Absolute 0.4 0.1 - 1.0 K/uL   Eosinophils Relative 0 %   Eosinophils Absolute 0.0 0.0 - 0.5 K/uL   Basophils Relative 0 %   Basophils Absolute 0.0 0.0 - 0.1 K/uL   Immature Granulocytes 0 %   Abs Immature Granulocytes 0.02 0.00 - 0.07 K/uL  Comprehensive metabolic panel     Status: Abnormal   Collection Time: 10/30/19  6:14 PM  Result Value Ref Range   Sodium 135 135 - 145 mmol/L   Potassium 3.6 3.5 - 5.1 mmol/L   Chloride 103 98 - 111 mmol/L   CO2 22 22 - 32 mmol/L   Glucose, Bld 89 70 - 99 mg/dL   BUN 8 6 - 20 mg/dL   Creatinine, Ser 5.780.55 0.44 - 1.00 mg/dL   Calcium 8.9 8.9 - 46.910.3 mg/dL   Total Protein 6.9 6.5 - 8.1 g/dL   Albumin 3.9 3.5 - 5.0 g/dL   AST 14 (L) 15 - 41 U/L   ALT 9 0 - 44 U/L   Alkaline Phosphatase 36 (L) 38 - 126 U/L   Total Bilirubin 0.8 0.3 - 1.2 mg/dL   GFR calc non Af Amer >60 >60 mL/min   GFR calc Af Amer >60 >60 mL/min   Anion gap 10 5 - 15  Lipase, blood     Status: None   Collection Time: 10/30/19  6:14 PM  Result Value Ref Range   Lipase 23 11 - 51 U/L  hCG, quantitative, pregnancy     Status: Abnormal   Collection  Time: 10/30/19  6:14 PM  Result Value Ref Range   hCG, Beta Chain, Quant, S 43,921 (H) <5 mIU/mL  ABO/Rh     Status: None   Collection Time: 10/30/19  6:14 PM  Result Value Ref Range   ABO/RH(D)      O POS Performed at [facility], [address] [place], [place] [address]    ____________________________________________  EKG____________________________________________  RADIOLOGY  I personally reviewed all radiographic images ordered to evaluate for the above acute complaints and reviewed radiology reports and findings.  These findings were personally discussed with the patient.  Please see medical record for radiology report.  ____________________________________________   PROCEDURES  Procedure(s) performed:  Procedures    Critical Care performed: no ____________________________________________   INITIAL IMPRESSION / ASSESSMENT AND PLAN / ED COURSE  Pertinent labs & imaging results that were available during my care of the patient were reviewed by me and considered in my medical decision making (see chart for details).   DDX: Ectopic, cyst, miscarriage, appendicitis, colitis, torsion, UTI, Pilo, stone  Susan Santos is a 18 y.o. who presents to the ED with symptoms as described above.  Patient is very well-appearing in no acute distress.  She is currently pain-free.  Benign abdominal exam.  Not consistent with appendicitis colitis Pilo or stone low suspicion for torsion.  No discharge.  As pregnant have low suspicion for pid.  Ultrasound shows no evidence of ectopic.  There is right-sided cyst.  No free fluid.  Remains hemodynamically stable and serial abdominal exams have remained benign.  At this point do believe she cleared for outpatient follow-up.  Marland KitchenDiscussed need to return to ED in 12-24 hours for repeat abn exam if symptoms return.     The patient was evaluated in Emergency Department today for the symptoms described in the history of present illness.  He/she was evaluated in the context of the global COVID-19 pandemic, which necessitated consideration that the patient might be at risk for infection with the SARS-CoV-2 virus that causes COVID-19. Institutional protocols and algorithms that pertain to the evaluation of patients at risk for COVID-19 are in a state of rapid change based on information released by regulatory bodies including the CDC and federal and state organizations. These policies and algorithms were followed during the patient's care in the ED.  As part of my medical decision making, I reviewed the following data within the electronic MEDICAL RECORD NUMBER Nursing notes reviewed and incorporated, Labs reviewed, notes from prior ED visits and [place] Controlled Substance Database   ____________________________________________   FINAL CLINICAL IMPRESSION(S) / ED DIAGNOSES  Final diagnoses:  Pelvic pain affecting pregnancy in first trimester, antepartum      NEW MEDICATIONS STARTED DURING THIS VISIT:  New Prescriptions   No medications on file     Note:  This document was prepared using Dragon voice recognition software and may include unintentional dictation errors.    Willy Eddy, MD 10/30/19 1930

## 2019-10-30 NOTE — Discharge Instructions (Signed)

## 2019-11-04 ENCOUNTER — Encounter: Payer: Self-pay | Admitting: Emergency Medicine

## 2019-11-04 ENCOUNTER — Other Ambulatory Visit: Payer: Self-pay

## 2019-11-04 ENCOUNTER — Emergency Department
Admission: EM | Admit: 2019-11-04 | Discharge: 2019-11-04 | Disposition: A | Discharge status: Home / Self Care | Payer: Medicaid Other | Attending: Emergency Medicine | Admitting: Emergency Medicine

## 2019-11-04 DIAGNOSIS — Z3A01 Less than 8 weeks gestation of pregnancy: Secondary | ICD-10-CM | POA: Insufficient documentation

## 2019-11-04 DIAGNOSIS — O200 Threatened abortion: Secondary | ICD-10-CM

## 2019-11-04 DIAGNOSIS — O209 Hemorrhage in early pregnancy, unspecified: Secondary | ICD-10-CM

## 2019-11-04 DIAGNOSIS — O26891 Other specified pregnancy related conditions, first trimester: Secondary | ICD-10-CM

## 2019-11-04 DIAGNOSIS — R102 Pelvic and perineal pain: Secondary | ICD-10-CM

## 2019-11-04 DIAGNOSIS — Z79899 Other long term (current) drug therapy: Secondary | ICD-10-CM | POA: Insufficient documentation

## 2019-11-04 LAB — COMPREHENSIVE METABOLIC PANEL
ALT: 9 U/L (ref 0–44)
AST: 15 U/L (ref 15–41)
Albumin: 4.4 g/dL (ref 3.5–5.0)
Alkaline Phosphatase: 41 U/L (ref 38–126)
Anion gap: 9 (ref 5–15)
BUN: 9 mg/dL (ref 6–20)
CO2: 23 mmol/L (ref 22–32)
Calcium: 9.4 mg/dL (ref 8.9–10.3)
Chloride: 106 mmol/L (ref 98–111)
Creatinine, Ser: 0.65 mg/dL (ref 0.44–1.00)
GFR calc Af Amer: >60 mL/min (ref >60)
GFR calc non Af Amer: >60 mL/min (ref >60)
Glucose, Bld: 83 mg/dL (ref 70–99)
Potassium: 3.7 mmol/L (ref 3.5–5.1)
Sodium: 138 mmol/L (ref 135–145)
Total Bilirubin: 0.8 mg/dL (ref 0.3–1.2)
Total Protein: 7.7 g/dL (ref 6.5–8.1)

## 2019-11-04 LAB — CBC WITH DIFFERENTIAL/PLATELET
Abs Immature Granulocytes: 0.04 10*3/uL (ref 0.00–0.07)
Basophils Absolute: 0.1 10*3/uL (ref 0.0–0.1)
Basophils Relative: 1 %
Eosinophils Absolute: 0 10*3/uL (ref 0.0–0.5)
Eosinophils Relative: 0 %
HCT: 34.7 % — ABNORMAL LOW (ref 36.0–46.0)
Hemoglobin: 12.4 g/dL (ref 12.0–15.0)
Immature Granulocytes: 0 %
Lymphocytes Relative: 25 %
Lymphs Abs: 2.6 10*3/uL (ref 0.7–4.0)
MCH: 30.5 pg (ref 26.0–34.0)
MCHC: 35.7 g/dL (ref 30.0–36.0)
MCV: 85.3 fL (ref 80.0–100.0)
Monocytes Absolute: 0.7 10*3/uL (ref 0.1–1.0)
Monocytes Relative: 6 %
Neutro Abs: 7.1 10*3/uL (ref 1.7–7.7)
Neutrophils Relative %: 68 %
Platelets: 223 10*3/uL (ref 150–400)
RBC: 4.07 MIL/uL (ref 3.87–5.11)
RDW: 11.7 % (ref 11.5–15.5)
WBC: 10.4 10*3/uL (ref 4.0–10.5)
nRBC: 0 % (ref 0.0–0.2)

## 2019-11-04 LAB — URINALYSIS, COMPLETE (UACMP) WITH MICROSCOPIC
Bilirubin Urine: NEGATIVE
Glucose, UA: NEGATIVE mg/dL
Ketones, ur: NEGATIVE mg/dL
Nitrite: NEGATIVE
Protein, ur: 30 mg/dL — AB
Specific Gravity, Urine: 1.02 (ref 1.005–1.030)
pH: 8 (ref 5.0–8.0)

## 2019-11-04 LAB — ABO/RH: ABO/RH(D): O POS

## 2019-11-04 LAB — HCG, QUANTITATIVE, PREGNANCY: hCG, Beta Chain, Quant, S: 78627 m[IU]/mL — ABNORMAL HIGH (ref <5)

## 2019-11-04 NOTE — ED Notes (Signed)
Dr. Alfred Levins at bedside to perform bedside US with this RN present.

## 2019-11-04 NOTE — ED Provider Notes (Signed)
[facility] Emergency Department Provider Note  ____________________________________________  Time seen: Approximately 2:34 AM  I have reviewed the triage vital signs and the nursing notes.   HISTORY  Chief Complaint Vaginal Bleeding   HPI Susan Santos is a 18 y.o. female G2P1 at [redacted] weeks GA who presents for evaluation of vaginal bleeding.  Patient in reports that her symptoms started earlier today.  Initially she had large amount of bleeding, right now she is just spotting.   Initially had some abdominal cramping which has now resolved.  Currently she has no abdominal pain, no dizziness, no chest pain.  Past Medical History:  Diagnosis Date   Gallstones    Postpartum anemia 07/26/2017   Vaginal delivery 2018   girl    Patient Active Problem List   Diagnosis Date Noted   Symptomatic cholelithiasis 03/07/2019   Postpartum anemia 07/26/2017    Past Surgical History:  Procedure Laterality Date   CHOLECYSTECTOMY N/A 06/11/2019   Procedure: LAPAROSCOPIC CHOLECYSTECTOMY;  Surgeon: Henrene Dodge, MD;  Location: [facility] ORS;  Service: General;  Laterality: N/A;   NO PAST SURGERIES      Prior to Admission medications   Medication Sig Start Date End Date Taking? Authorizing Provider  polyethylene glycol (MIRALAX / GLYCOLAX) 17 g packet Take 17 g by mouth daily. Mix one tablespoon with 8oz of your favorite juice or water every day until you are having soft formed stools. Then start taking once daily if you didn't have a stool the day before. 10/30/19   Willy Eddy, MD  Prenatal Vit-Fe Fumarate-FA (PRENATAL VITAMIN) 27-0.8 MG TABS Take 1 tablet by mouth daily. 10/25/19 02/02/20  Federico Flake, MD    Allergies Patient has no known allergies.  Family History  Problem Relation Age of Onset   Diabetes Mother     Social History Social History   Tobacco Use   Smoking status: Never Smoker   Smokeless tobacco: Never Used  Substance  Use Topics   Alcohol use: No   Drug use: No    Review of Systems  Constitutional: Negative for fever. Eyes: Negative for visual changes. ENT: Negative for sore throat. Neck: No neck pain  Cardiovascular: Negative for chest pain. Respiratory: Negative for shortness of breath. Gastrointestinal: Negative for abdominal pain, vomiting or diarrhea. Genitourinary: Negative for dysuria. + vaginal bleeding Musculoskeletal: Negative for back pain. Skin: Negative for rash. Neurological: Negative for headaches, weakness or numbness. Psych: No SI or HI  ____________________________________________   PHYSICAL EXAM:  VITAL SIGNS: ED Triage Vitals [11/04/19 0028]  Enc Vitals Group     BP 115/60     Pulse Rate (!) 58     Resp 18     Temp 98.7 F (37.1 C)     Temp Source Oral     SpO2 100 %     Weight 124 lb (56.2 kg)     Height 5\' 2"  (1.575 m)     Head Circumference      Peak Flow      Pain Score 5     Pain Loc      Pain Edu?      Excl. in GC?     Constitutional: Alert and oriented. Well appearing and in no apparent distress. HEENT:      Head: Normocephalic and atraumatic.         Eyes: Conjunctivae are normal. Sclera is non-icteric.       Mouth/Throat: Mucous membranes are moist.  Neck: Supple with no signs of meningismus. Cardiovascular: Regular rate and rhythm. No murmurs, gallops, or rubs. 2+ symmetrical distal pulses are present in all extremities. No JVD. Respiratory: Normal respiratory effort. Lungs are clear to auscultation bilaterally. No wheezes, crackles, or rhonchi.  Gastrointestinal: Soft, non tender, and non distended with positive bowel sounds. No rebound or guarding. Pelvic exam: Normal external genitalia, no rashes or lesions. Blood in the vaginal vault. Os closed. No cervical motion tenderness.  No uterine or adnexal tenderness.   Musculoskeletal: Nontender with normal range of motion in all extremities. No edema, cyanosis, or erythema of  extremities. Neurologic: Normal speech and language. Face is symmetric. Moving all extremities. No gross focal neurologic deficits are appreciated. Skin: Skin is warm, dry and intact. No rash noted. Psychiatric: Mood and affect are normal. Speech and behavior are normal.  ____________________________________________   LABS (all labs ordered are listed, but only abnormal results are displayed)  Labs Reviewed  CBC WITH DIFFERENTIAL/PLATELET - Abnormal; Notable for the following components:      Result Value   HCT 34.7 (*)    All other components within normal limits  URINALYSIS, COMPLETE (UACMP) WITH MICROSCOPIC - Abnormal; Notable for the following components:   Color, Urine YELLOW (*)    APPearance CLOUDY (*)    Hgb urine dipstick SMALL (*)    Protein, ur 30 (*)    Leukocytes,Ua SMALL (*)    Bacteria, UA RARE (*)    All other components within normal limits  COMPREHENSIVE METABOLIC PANEL  HCG, QUANTITATIVE, PREGNANCY  POC URINE PREG, ED  ABO/RH   ____________________________________________  EKG  none  ____________________________________________  RADIOLOGY  none  ____________________________________________   PROCEDURES  Procedure(s) performed: None Procedures Critical Care performed:  None ____________________________________________   INITIAL IMPRESSION / ASSESSMENT AND PLAN / ED COURSE  18 y.o. female G2P1 at [redacted] weeks GA who presents for evaluation of vaginal bleeding.  Patient had a formal ultrasound for this current pregnancy 5 days ago.  Bedside ultrasound showing IUP with fetal heart rate in the 120s, pelvic exam shows closed os.  Labs showing no signs of acute blood loss anemia, patient is hemodynamically stable.  Patient is O+ with no indication for RhoGam.  Discussed pelvic rest.  Patient has a follow-up with her OB in 5 days for repeat ultrasound.  Differential diagnosis including threatened miscarriage versus subchorionic hemorrhage versus bleeding or  first trimester.       As part of my medical decision making, I reviewed the following data within the electronic MEDICAL RECORD NUMBER Nursing notes reviewed and incorporated, Labs reviewed , Old chart reviewed, Notes from prior ED visits and [place] Controlled Substance Database   Please note:  Patient was evaluated in Emergency Department today for the symptoms described in the history of present illness. Patient was evaluated in the context of the global COVID-19 pandemic, which necessitated consideration that the patient might be at risk for infection with the SARS-CoV-2 virus that causes COVID-19. Institutional protocols and algorithms that pertain to the evaluation of patients at risk for COVID-19 are in a state of rapid change based on information released by regulatory bodies including the CDC and federal and state organizations. These policies and algorithms were followed during the patient's care in the ED.  Some ED evaluations and interventions may be delayed as a result of limited staffing during the pandemic.   ____________________________________________   FINAL CLINICAL IMPRESSION(S) / ED DIAGNOSES   Final diagnoses:  Threatened miscarriage  NEW MEDICATIONS STARTED DURING THIS VISIT:  ED Discharge Orders    None       Note:  This document was prepared using Dragon voice recognition software and may include unintentional dictation errors.    Rudene Re, MD 11/04/19 [phone]

## 2019-11-04 NOTE — ED Triage Notes (Signed)
Patient states that she is [redacted] weeks pregnant and started bleeding about 19:00 tonight. Patient states that it is a moderate amount of bleeding. Patient with complaint of abdominal cramping.

## 2019-11-04 NOTE — ED Notes (Addendum)
Pt c/o vaginal bleeding that started today around 7pm. Pt denies vaginal pain but states she has intermittent cramping. Pt states she is [redacted]wks pregnant.

## 2019-11-09 ENCOUNTER — Encounter: Payer: Medicaid Other | Admitting: Certified Nurse Midwife

## 2020-02-22 ENCOUNTER — Ambulatory Visit (LOCAL_COMMUNITY_HEALTH_CENTER): Payer: Medicaid Other | Admitting: Family Medicine

## 2020-02-22 ENCOUNTER — Ambulatory Visit: Payer: Medicaid Other

## 2020-02-22 ENCOUNTER — Other Ambulatory Visit: Payer: Self-pay

## 2020-02-22 ENCOUNTER — Encounter: Payer: Self-pay | Admitting: Family Medicine

## 2020-02-22 VITALS — BP 103/67 | Ht 62.0 in | Wt 129.4 lb

## 2020-02-22 DIAGNOSIS — Z3009 Encounter for other general counseling and advice on contraception: Secondary | ICD-10-CM | POA: Diagnosis not present

## 2020-02-22 MED ORDER — MEDROXYPROGESTERONE ACETATE 150 MG/ML IM SUSP
150.0000 mg | INTRAMUSCULAR | Status: DC
  Administered 2020-02-22: 150 mg via INTRAMUSCULAR

## 2020-02-22 NOTE — Progress Notes (Signed)
In to continue Depo-has been getting @ [facility]; last rec'd 11/26/19; declines HIV/RPR Sharlette Dense, RN

## 2020-02-22 NOTE — Progress Notes (Signed)
Family Planning Visit  Subjective:  Susan Santos is a 19 y.o. being seen today for  Chief Complaint  Patient presents with  . Contraception    Pt has Postpartum anemia and Symptomatic cholelithiasis on their problem list.  HPI  Patient reports she is here for continuation of depo. She had her first injection 12 wk 4 days ago at [facility]. She has no period w/this.   She was also treated for [facility], [place], and trich at [facility] in December. She has not had a check for re-infection. Denies symptoms today.    Pt denies all of the following, which are contraindications to Depo use: Known breast cancer Pregnancy Also denies: Hypertension (CDC cat 2 if mild, cat 3 if severe) Severe cirrhosis, hepatocellular adenoma Diabetes with nephrosis or vascular complications Ischemic heart disease or multiple risk factors for atherosclerotic disease, and some forms of lupus Unexplained vaginal bleeding Pregnancy planned within the next year Long-term use of corticosteroid therapy in women with a history of, or risk factors for, nontraumatic (frailty) fractures.  Current use of aminoglutethimide (usually for the treatment of Cushing's syndrome) because aminoglutethimide may increase metabolism of progestins   Patient's last menstrual period was 09/17/2019 (exact date). BCM: depo Pt desires EC? n/a  Last pap: will be due in May  Last breast exam: n/a  Patient reports 2 partner(s) in last year. Do they desire STI screening (if no, why not)? No, no time today  Does the patient desire a pregnancy in the next year? no   19 y.o., Body mass index is 23.67 kg/m. - Is patient eligible for HA1C diabetes screening based on BMI and age >43?  no  Does the patient have a current or past history of drug use? no No components found for: HCV  See flowsheet for other program required questions.   Health Maintenance Due  Topic Date Due  . CHLAMYDIA SCREENING  Never done  . INFLUENZA VACCINE  Never done     ROS  The following portions of the patient's history were reviewed and updated as appropriate: allergies, current medications, past family history, past medical history, past social history, past surgical history and problem list. Problem list updated.  Objective:  BP 103/67   Ht 5\' 2"  (1.575 m)   Wt 129 lb 6.4 oz (58.7 kg)   LMP 09/17/2019 (Exact Date) Comment:    Breastfeeding Unknown   BMI 23.67 kg/m    Physical Exam  Gen: well appearing, NAD HEENT: no scleral icterus Lung: Normal WOB Ext: well perfused, no edema    Assessment and Plan:  Susan Santos is a 19 y.o. female presenting to the [facility] Department for a well woman exam/family planning visit  Contraception counseling: Reviewed all forms of birth control options in the tiered based approach including abstinence; over the counter/barrier methods; hormonal contraceptive medication including pill, patch, ring, injection, contraceptive implant; hormonal and nonhormonal IUDs; permanent sterilization options including vasectomy and the various tubal sterilization modalities. Risks, benefits, how to discontinue and typical effectiveness rates were reviewed.  Questions were answered.  Written information was also given to the patient to review.  Patient desires depo, this was prescribed for patient. She will follow up in  1 year for surveillance.  She was told to call with any further questions, or with any concerns about this method of contraception.  Emphasized use of condoms 100% of the time for STI prevention.  Emergency Contraception: n/a- continuous use of depo    1. Family  planning services -Rx depo x1 yr. Counseling as above. -Advised to RTC for pap, CBE in May at age 57. -Advised re-test for GC, CT and trich as she was treated for these in December. She declines today d/t time but may get them when she is due for pap in May. Denies symptoms. - medroxyPROGESTERone (DEPO-PROVERA) injection 150  mg    Return in about 2 months (around 04/23/2020) for pap and STI re-check.  No future appointments.  Ann Held, PA-C

## 2020-06-29 IMAGING — US ULTRASOUND ABDOMEN LIMITED
1 series · 14 of 25 positions shown · non-contrast
Comparison: None.

CLINICAL DATA: 17-year-old female with right upper quadrant
abdominal pain for the past 2 weeks but worse today.

EXAM:
ULTRASOUND ABDOMEN LIMITED RIGHT UPPER QUADRANT

[Series 1: ultrasound abdomen limited · 0.19mm/px · 14 of 52 slices shown]
[im 1/52]
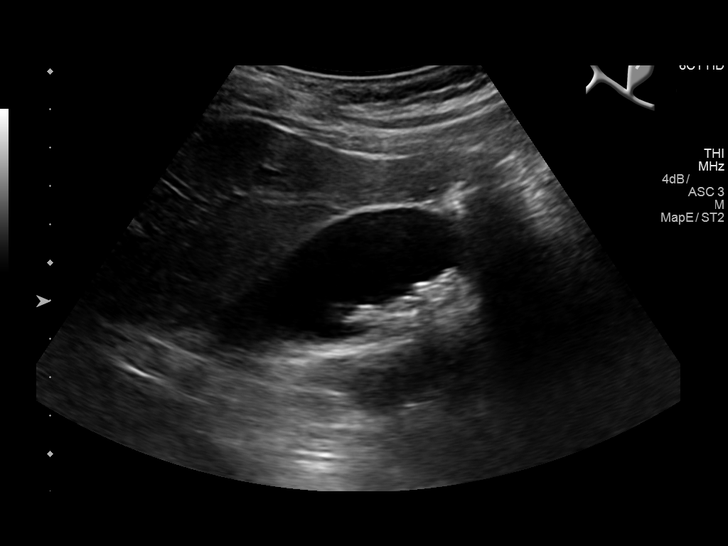
[im 5/52]
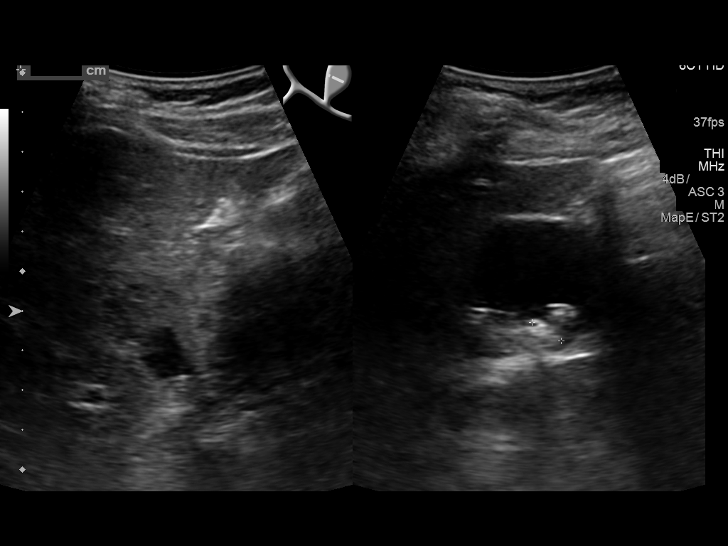
[im 9/52]
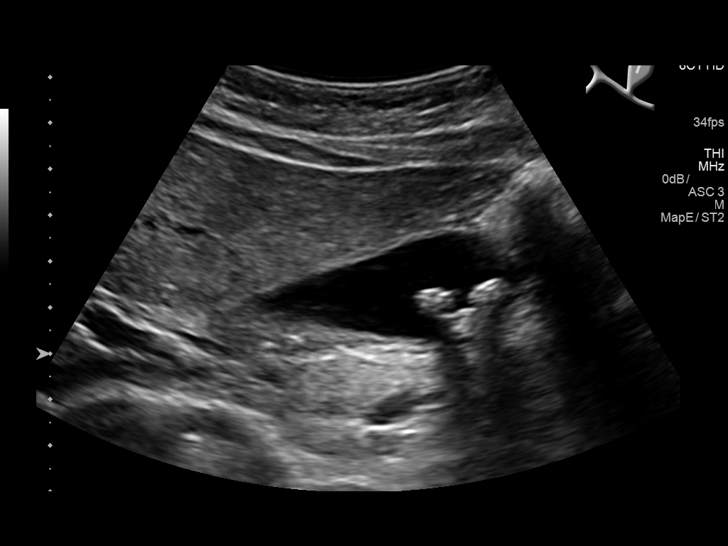
[im 13/52]
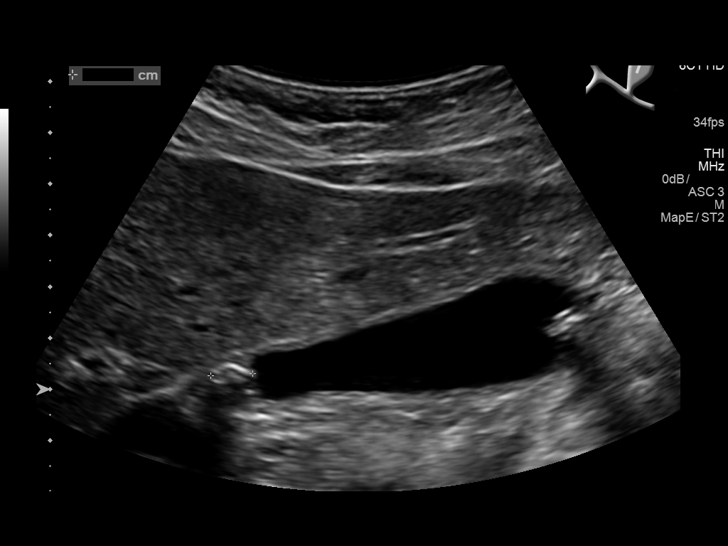
[im 18/52]
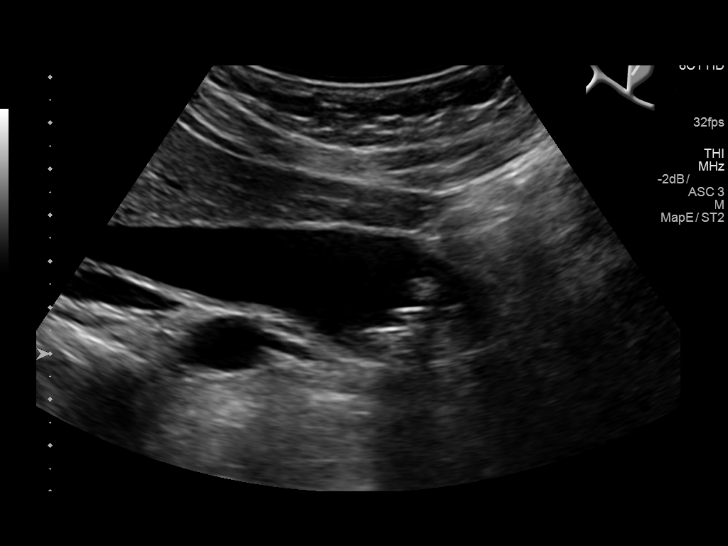
[im 20/52]
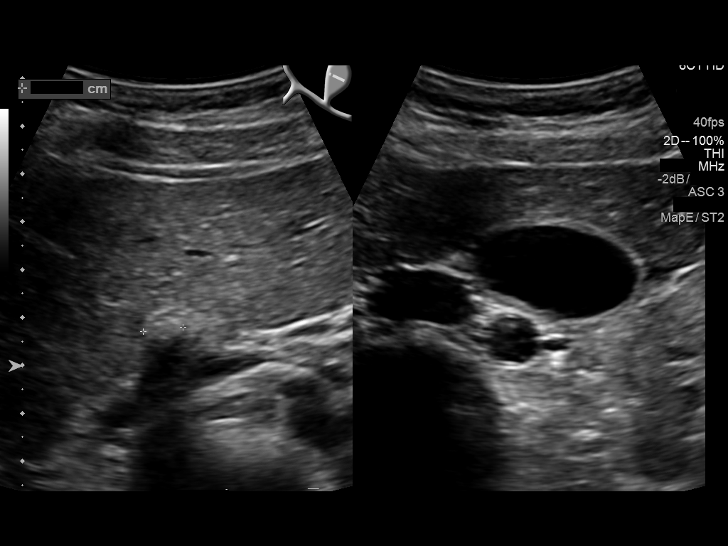
[im 24/52]
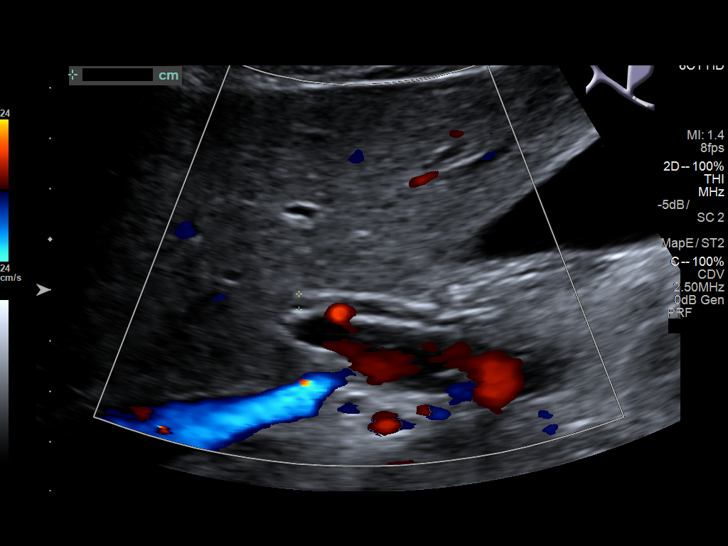
[im 28/52]
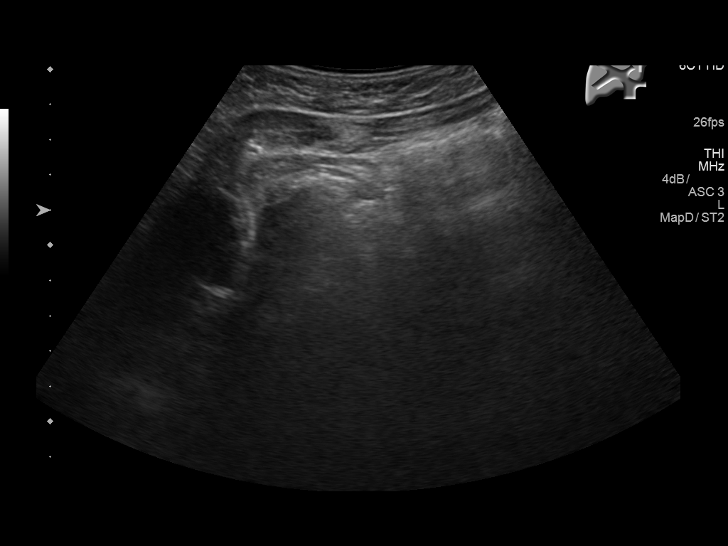
[im 32/52]
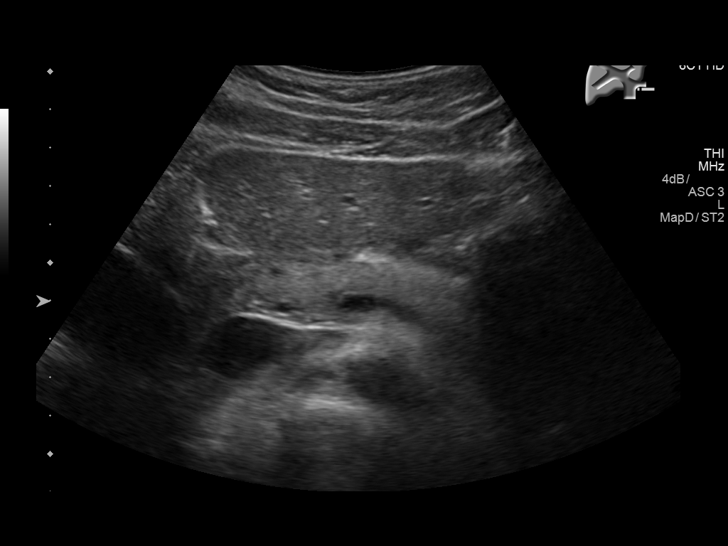
[im 35/52]
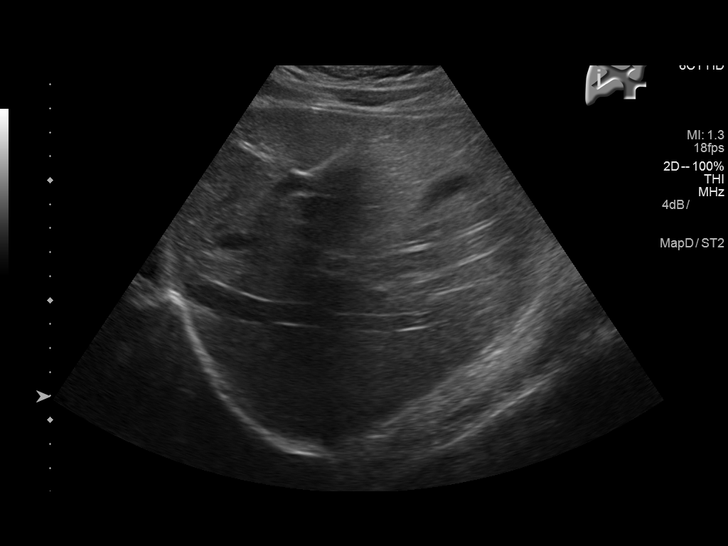
[im 39/52]
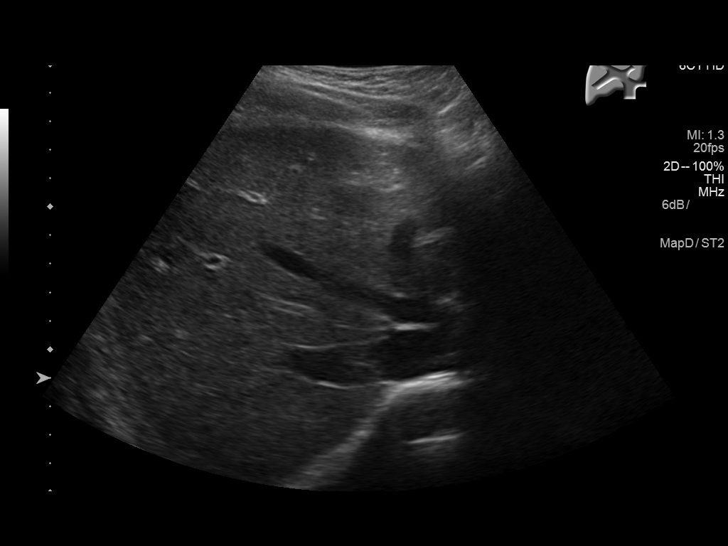
[im 43/52]
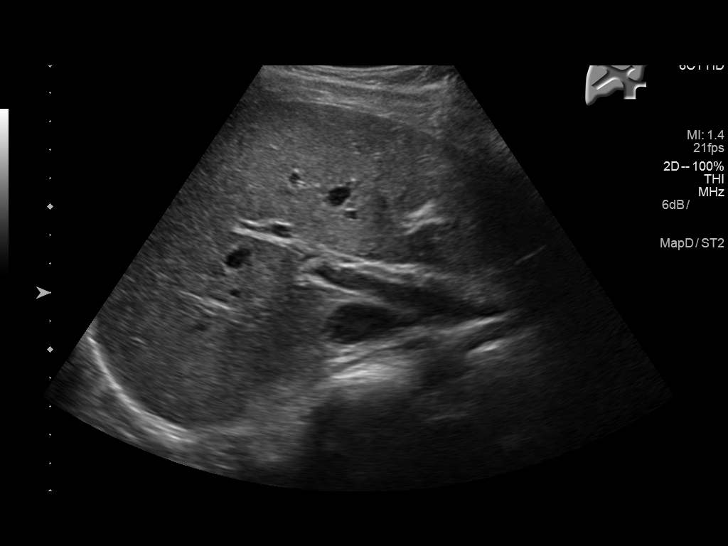
[im 47/52]
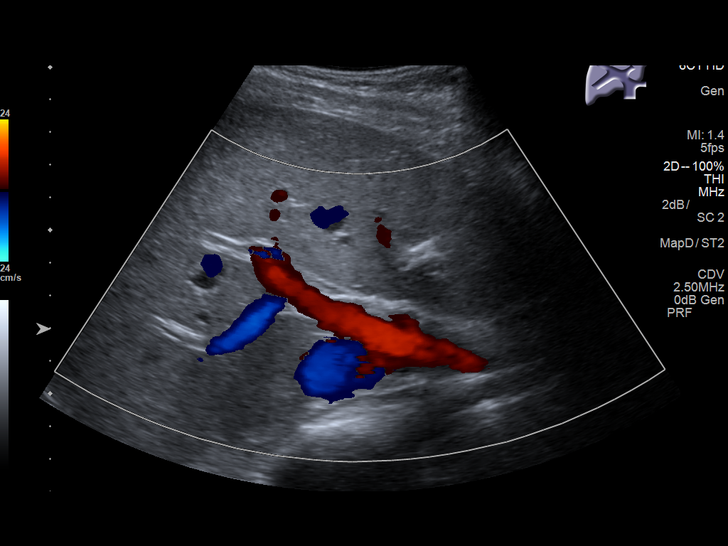
[im 52/52]
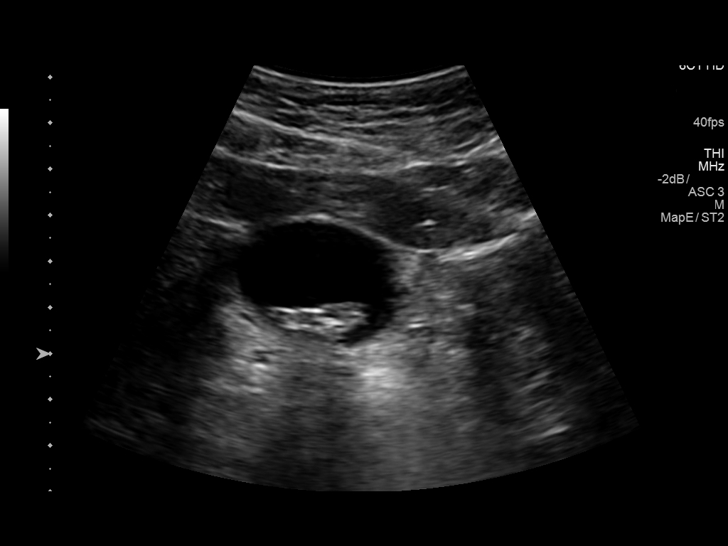

[14 of 25 positions shown; findings below may reference images not displayed]

FINDINGS: Gallbladder:

Multiple echogenic foci layer within the gallbladder lumen. The foci
demonstrates posterior acoustic shadowing consistent with
cholelithiasis. Many of the stones are mobile, however there is 1
stone in the gallbladder neck which is immobile. No evidence of
gallbladder distention, wall thickening or pericholecystic fluid.

Common bile duct:

Diameter: Normal at 3 mm.

Liver:

No focal lesion identified. Within normal limits in parenchymal
echogenicity. Portal vein is patent on color Doppler imaging with
normal direction of blood flow towards the liver.
IMPRESSION: 1. Cholelithiasis. The majority of the stones are mobile, however 1
stone in the gallbladder neck did not move during the course of the
examination and may represent a source for biliary colic.
2. No secondary sonographic findings of acute cholecystitis.

## 2020-07-31 ENCOUNTER — Other Ambulatory Visit: Payer: Self-pay

## 2020-07-31 DIAGNOSIS — G43909 Migraine, unspecified, not intractable, without status migrainosus: Secondary | ICD-10-CM | POA: Insufficient documentation

## 2020-07-31 NOTE — ED Triage Notes (Signed)
Patient c/o migraine headache beginning this afternoon. Patient reports hx of the same. Patient reports taking 2 tylenol approx 30 minutes prior to arrival.

## 2020-08-01 ENCOUNTER — Emergency Department
Admission: EM | Admit: 2020-08-01 | Discharge: 2020-08-01 | Disposition: A | Discharge status: Home / Self Care | Payer: Medicaid Other | Attending: Emergency Medicine | Admitting: Emergency Medicine

## 2020-08-01 DIAGNOSIS — G43901 Migraine, unspecified, not intractable, with status migrainosus: Secondary | ICD-10-CM

## 2020-08-01 MED ORDER — DIPHENHYDRAMINE HCL 25 MG PO CAPS: 50.0000 mg | ORAL_CAPSULE | Freq: Four times a day (QID) | ORAL | 0 refills | Status: DC | PRN

## 2020-08-01 MED ORDER — KETOROLAC TROMETHAMINE 10 MG PO TABS
10.0000 mg | ORAL_TABLET | Freq: Once | ORAL | Status: AC
  Administered 2020-08-01: 10 mg via ORAL
  Filled 2020-08-01: qty 1

## 2020-08-01 MED ORDER — DIPHENHYDRAMINE HCL 25 MG PO CAPS
25.0000 mg | ORAL_CAPSULE | Freq: Once | ORAL | Status: AC
  Administered 2020-08-01: 25 mg via ORAL
  Filled 2020-08-01: qty 1

## 2020-08-01 MED ORDER — METOCLOPRAMIDE HCL 10 MG PO TABS
10.0000 mg | ORAL_TABLET | Freq: Once | ORAL | Status: AC
  Administered 2020-08-01: 10 mg via ORAL
  Filled 2020-08-01: qty 1

## 2020-08-01 MED ORDER — METOCLOPRAMIDE HCL 10 MG PO TABS: 10.0000 mg | ORAL_TABLET | Freq: Four times a day (QID) | ORAL | 0 refills | Status: DC | PRN

## 2020-08-01 MED ORDER — NAPROXEN 500 MG PO TABS: 500.0000 mg | ORAL_TABLET | Freq: Two times a day (BID) | ORAL | 0 refills | Status: DC

## 2020-08-01 NOTE — ED Provider Notes (Signed)
[facility] Emergency Department Provider Note  ____________________________________________  Time seen: Approximately 4:21 AM  I have reviewed the triage vital signs and the nursing notes.   HISTORY  Chief Complaint Headache    HPI [name] is a 19 y.o. female with a history of gallstones and migraine headaches who comes ED complaining of right-sided headache that started this afternoon, gradual onset, constant, nonradiating, no aggravating or alleviating factors, not relieved by taking Tylenol at home.  No vision changes paresthesias or weakness, no trauma, no fevers chills or neck stiffness.  Feels just like established headache pattern which occurs every 2 to 3 weeks typically.  She does endorse photophobia and phonophobia as well.      Past Medical History:  Diagnosis Date  . Gallstones   . Postpartum anemia 07/26/2017  . Vaginal delivery 2018   girl     Patient Active Problem List   Diagnosis Date Noted  . Symptomatic cholelithiasis 03/07/2019  . Postpartum anemia 07/26/2017     Past Surgical History:  Procedure Laterality Date  . CHOLECYSTECTOMY N/A 06/11/2019   Procedure: LAPAROSCOPIC CHOLECYSTECTOMY;  Surgeon: Henrene Dodge, MD;  Location: [facility] ORS;  Service: General;  Laterality: N/A;  . NO PAST SURGERIES       Prior to Admission medications   Medication Sig Start Date End Date Taking? Authorizing Provider  diphenhydrAMINE (BENADRYL) 25 mg capsule Take 2 capsules (50 mg total) by mouth every 6 (six) hours as needed. 08/01/20   Sharman Cheek, MD  metoCLOPramide (REGLAN) 10 MG tablet Take 1 tablet (10 mg total) by mouth every 6 (six) hours as needed. 08/01/20   Sharman Cheek, MD  naproxen (NAPROSYN) 500 MG tablet Take 1 tablet (500 mg total) by mouth 2 (two) times daily with a meal. 08/01/20   Sharman Cheek, MD     Allergies Patient has no known allergies.   Family History  Problem Relation Age of Onset  . Diabetes  Mother   . Breast cancer Neg Hx     Social History Social History   Tobacco Use  . Smoking status: Never Smoker  . Smokeless tobacco: Never Used  Vaping Use  . Vaping Use: Never used  Substance Use Topics  . Alcohol use: Yes    Comment: occasional  . Drug use: No    Review of Systems  Constitutional:   No fever or chills.  ENT:   No sore throat. No rhinorrhea. Cardiovascular:   No chest pain or syncope. Respiratory:   No dyspnea or cough. Gastrointestinal:   Negative for abdominal pain, vomiting and diarrhea.  Musculoskeletal:   Negative for focal pain or swelling All other systems reviewed and are negative except as documented above in ROS and HPI.  ____________________________________________   PHYSICAL EXAM:  VITAL SIGNS: ED Triage Vitals  Enc Vitals Group     BP 07/31/20 2200 (!) 116/57     Pulse Rate 07/31/20 2200 (!) 51     Resp 07/31/20 2200 19     Temp 07/31/20 2200 98 F (36.7 C)     Temp Source 07/31/20 2200 Oral     SpO2 07/31/20 2200 100 %     Weight 07/31/20 2203 140 lb (63.5 kg)     Height 07/31/20 2203 5\' 2"  (1.575 m)     Head Circumference --      Peak Flow --      Pain Score 07/31/20 2203 8     Pain Loc --  Pain Edu? --      Excl. in GC? --     Vital signs reviewed, nursing assessments reviewed.   Constitutional:   Alert and oriented. Non-toxic appearance. Eyes:   Conjunctivae are normal. EOMI. PERRL. ENT      Head:   Normocephalic and atraumatic.      Nose:   Normal      Mouth/Throat:   Normal      Neck:   No meningismus. Full ROM. Hematological/Lymphatic/Immunilogical:   No cervical lymphadenopathy. Cardiovascular:   RRR.  Respiratory:   Normal respiratory effort without tachypnea/retractions. Breath sounds are clear and equal bilaterally. No wheezes/rales/rhonchi. Musculoskeletal:   Normal range of motion in all extremities. No joint effusions.  No lower extremity tenderness.  No edema. Neurologic:   Normal speech and  language.  Motor grossly intact. No acute focal neurologic deficits are appreciated.  ____________________________________________    LABS (pertinent positives/negatives) (all labs ordered are listed, but only abnormal results are displayed) Labs Reviewed - No data to display ____________________________________________   EKG    ____________________________________________    RADIOLOGY  No results found.  ____________________________________________   PROCEDURES Procedures  ____________________________________________    CLINICAL IMPRESSION / ASSESSMENT AND PLAN / ED COURSE  Medications ordered in the ED: Medications  metoCLOPramide (REGLAN) tablet 10 mg (10 mg Oral Given 08/01/20 0330)  diphenhydrAMINE (BENADRYL) capsule 25 mg (25 mg Oral Given 08/01/20 0330)  ketorolac (TORADOL) tablet 10 mg (10 mg Oral Given 08/01/20 0330)    Pertinent labs & imaging results that were available during my care of the patient were reviewed by me and considered in my medical decision making (see chart for details).  Susan Santos was evaluated in Emergency Department on 08/01/2020 for the symptoms described in the history of present illness. She was evaluated in the context of the global COVID-19 pandemic, which necessitated consideration that the patient might be at risk for infection with the SARS-CoV-2 virus that causes COVID-19. Institutional protocols and algorithms that pertain to the evaluation of patients at risk for COVID-19 are in a state of rapid change based on information released by regulatory bodies including the CDC and federal and state organizations. These policies and algorithms were followed during the patient's care in the ED.   Patient presents with typical migraine headache. Considering the patient's symptoms, medical history, and physical examination today, I have low suspicion for ischemic stroke, intracranial hemorrhage, meningitis, encephalitis, carotid or  vertebral dissection, venous sinus thrombosis, MS, intracranial hypertension, glaucoma, CRAO, CRVO, or temporal arteritis. Vital signs are normal, she is nontoxic and well-appearing.  Feels like she can take oral medications, so give her Toradol, Reglan, Benadryl by mouth and reassess.      ____________________________________________   FINAL CLINICAL IMPRESSION(S) / ED DIAGNOSES    Final diagnoses:  Migraine with status migrainosus, not intractable, unspecified migraine type     ED Discharge Orders         Ordered    naproxen (NAPROSYN) 500 MG tablet  2 times daily with meals        08/01/20 0420    metoCLOPramide (REGLAN) 10 MG tablet  Every 6 hours PRN        08/01/20 0420    diphenhydrAMINE (BENADRYL) 25 mg capsule  Every 6 hours PRN        08/01/20 0420          Portions of this note were generated with dragon dictation software. Dictation errors may occur despite best attempts at  proofreading.   Sharman Cheek, MD 08/01/20 [phone]

## 2020-08-01 NOTE — ED Notes (Signed)
ED Provider at bedside. 

## 2020-08-01 NOTE — ED Notes (Signed)
Reviewed discharge instructions, follow-up care, and prescriptions with patient. Patient verbalized understanding of all information reviewed. Patient stable, with no distress noted at this time.    

## 2020-10-23 ENCOUNTER — Other Ambulatory Visit: Payer: Self-pay

## 2020-10-23 ENCOUNTER — Ambulatory Visit (LOCAL_COMMUNITY_HEALTH_CENTER): Payer: Medicaid Other

## 2020-10-23 VITALS — Ht 63.0 in | Wt 139.0 lb

## 2020-10-23 DIAGNOSIS — Z3201 Encounter for pregnancy test, result positive: Secondary | ICD-10-CM

## 2020-10-23 LAB — PREGNANCY, URINE: Preg Test, Ur: POSITIVE — AB

## 2020-10-23 MED ORDER — PRENATAL VITAMIN 27-0.8 MG PO TABS: 1.0000 | ORAL_TABLET | ORAL | 0 refills | Status: AC

## 2020-10-23 NOTE — Progress Notes (Signed)
Patient desires her prenatal care at [facility]. Hart Carwin, RN

## 2020-11-19 ENCOUNTER — Other Ambulatory Visit: Payer: Self-pay | Admitting: Certified Nurse Midwife

## 2020-11-19 ENCOUNTER — Other Ambulatory Visit (INDEPENDENT_AMBULATORY_CARE_PROVIDER_SITE_OTHER): Payer: Medicaid Other

## 2020-11-19 ENCOUNTER — Ambulatory Visit (INDEPENDENT_AMBULATORY_CARE_PROVIDER_SITE_OTHER): Payer: Medicaid Other | Admitting: Certified Nurse Midwife

## 2020-11-19 ENCOUNTER — Other Ambulatory Visit: Payer: Self-pay

## 2020-11-19 VITALS — BP 99/54 | HR 81 | Ht 63.0 in | Wt 136.1 lb

## 2020-11-19 DIAGNOSIS — Z3481 Encounter for supervision of other normal pregnancy, first trimester: Secondary | ICD-10-CM | POA: Diagnosis not present

## 2020-11-19 DIAGNOSIS — Z789 Other specified health status: Secondary | ICD-10-CM

## 2020-11-19 DIAGNOSIS — Z3A08 8 weeks gestation of pregnancy: Secondary | ICD-10-CM | POA: Diagnosis not present

## 2020-11-19 NOTE — Progress Notes (Signed)
Pt presents for NOB nurse interview visit. Pregnancy confirmation done 10/23/2020 at [facility] Department. G 3 . P 1 0 1 1 . Pregnancy education material explained and given. 0 cats in the home. NOB labs ordered. (sickle cell). HIV labs and Drug screen were explained optional and she did not decline. Drug screen ordered. PNV encouraged. Pt given gummy PNV (as requested) today inoffice. Genetic screening options discussed . Genetic testing: Desired. To be ordered at NOB physical Pt.  To follow up with provider in ___ weeks for NOB physical. Financial policy reviewed. FMLA paperwork policy reviewed and signed. All questions answered.  Pt unsure if LMP: 09/23/20 is exact date. Per Doreene Burke, CNM pt to have u/s. Pt is 10w 4d according to u/s. EDD: 06/13/2021 Pt did not leave enough urine. Urinalysis and Drug screen to be ordered at next visit.  Pt c/o nausea, informed her to try seabands since she is unable to keep pills down. She verbalized understanding.

## 2020-11-19 NOTE — Patient Instructions (Signed)
WHAT OB PATIENTS CAN EXPECT   Confirmation of pregnancy and ultrasound ordered if medically indicated-[redacted] weeks gestation  New OB (NOB) intake with nurse and New OB (NOB) labs- [redacted] weeks gestation  New OB (NOB) physical examination with provider- 11/[redacted] weeks gestation  Flu vaccine-[redacted] weeks gestation  Anatomy scan-[redacted] weeks gestation  Glucose tolerance test, blood work to test for anemia, T-dap vaccine-[redacted] weeks gestation  Vaginal swabs/cultures-STD/Group B strep-[redacted] weeks gestation  Appointments every 4 weeks until 28 weeks  Every 2 weeks from 28 weeks until 36 weeks  Weekly visits from 36 weeks until delivery  Morning Sickness  Morning sickness is when you feel sick to your stomach (nauseous) during pregnancy. You may feel sick to your stomach and throw up (vomit). You may feel sick in the morning, but you can feel this way at any time of day. Some women feel very sick to their stomach and cannot stop throwing up (hyperemesis gravidarum). Follow these instructions at home: Medicines  Take over-the-counter and prescription medicines only as told by your doctor. Do not take any medicines until you talk with your doctor about them first.  Taking multivitamins before getting pregnant can stop or lessen the harshness of morning sickness. Eating and drinking  Eat dry toast or crackers before getting out of bed.  Eat 5 or 6 small meals a day.  Eat dry and bland foods like rice and baked potatoes.  Do not eat greasy, fatty, or spicy foods.  Have someone cook for you if the smell of food causes you to feel sick or throw up.  If you feel sick to your stomach after taking prenatal vitamins, take them at night or with a snack.  Eat protein when you need a snack. Nuts, yogurt, and cheese are good choices.  Drink fluids throughout the day.  Try ginger ale made with real ginger, ginger tea made from fresh grated ginger, or ginger candies. General instructions  Do not use any products  that have nicotine or tobacco in them, such as cigarettes and e-cigarettes. If you need help quitting, ask your doctor.  Use an air purifier to keep the air in your house free of smells.  Get lots of fresh air.  Try to avoid smells that make you feel sick.  Try: ? Wearing a bracelet that is used for seasickness (acupressure wristband). ? Going to a doctor who puts thin needles into certain body points (acupuncture) to improve how you feel. Contact a doctor if:  You need medicine to feel better.  You feel dizzy or light-headed.  You are losing weight. Get help right away if:  You feel very sick to your stomach and cannot stop throwing up.  You pass out (faint).  You have very bad pain in your belly. Summary  Morning sickness is when you feel sick to your stomach (nauseous) during pregnancy.  You may feel sick in the morning, but you can feel this way at any time of day.  Making some changes to what you eat may help your symptoms go away. This information is not intended to replace advice given to you by your health care provider. Make sure you discuss any questions you have with your health care provider. Document Revised: 11/04/2017 Document Reviewed: 12/23/2016 Elsevier Patient Education  2020 [employer]. How a Baby Grows During Pregnancy  Pregnancy begins when a female's sperm enters a female's egg (fertilization). Fertilization usually happens in one of the tubes (fallopian tubes) that connect the ovaries to the  womb (uterus). The fertilized egg moves down the fallopian tube to the uterus. Once it reaches the uterus, it implants into the lining of the uterus and begins to grow. For the first 10 weeks, the fertilized egg is called an embryo. After 10 weeks, it is called a fetus. As the fetus continues to grow, it receives oxygen and nutrients through tissue (placenta) that grows to support the developing baby. The placenta is the life support system for the baby. It provides  oxygen and nutrition and removes waste. Learning as much as you can about your pregnancy and how your baby is developing can help you enjoy the experience. It can also make you aware of when there might be a problem and when to ask questions. How long does a typical pregnancy last? A pregnancy usually lasts 280 days, or about 40 weeks. Pregnancy is divided into three periods of growth, also called trimesters:  First trimester: 0-12 weeks.  Second trimester: 13-27 weeks.  Third trimester: 28-40 weeks. The day when your baby is ready to be born (full term) is your estimated date of delivery. How does my baby develop month by month? First month  The fertilized egg attaches to the inside of the uterus.  Some cells will form the placenta. Others will form the fetus.  The arms, legs, brain, spinal cord, lungs, and heart begin to develop.  At the end of the first month, the heart begins to beat. Second month  The bones, inner ear, eyelids, hands, and feet form.  The genitals develop.  By the end of 8 weeks, all major organs are developing. Third month  All of the internal organs are forming.  Teeth develop below the gums.  Bones and muscles begin to grow. The spine can flex.  The skin is transparent.  Fingernails and toenails begin to form.  Arms and legs continue to grow longer, and hands and feet develop.  The fetus is about 3 inches (7.6 cm) long. Fourth month  The placenta is completely formed.  The external sex organs, neck, outer ear, eyebrows, eyelids, and fingernails are formed.  The fetus can hear, swallow, and move its arms and legs.  The kidneys begin to produce urine.  The skin is covered with a white, waxy coating (vernix) and very fine hair (lanugo). Fifth month  The fetus moves around more and can be felt for the first time (quickening).  The fetus starts to sleep and wake up and may begin to suck its finger.  The nails grow to the end of the  fingers.  The organ in the digestive system that makes bile (gallbladder) functions and helps to digest nutrients.  If your baby is a girl, eggs are present in her ovaries. If your baby is a boy, testicles start to move down into his scrotum. Sixth month  The lungs are formed.  The eyes open. The brain continues to develop.  Your baby has fingerprints and toe prints. Your baby's hair grows thicker.  At the end of the second trimester, the fetus is about 9 inches (22.9 cm) long. Seventh month  The fetus kicks and stretches.  The eyes are developed enough to sense changes in light.  The hands can make a grasping motion.  The fetus responds to sound. Eighth month  All organs and body systems are fully developed and functioning.  Bones harden, and taste buds develop. The fetus may hiccup.  Certain areas of the brain are still developing. The skull remains soft.   Ninth month  The fetus gains about  lb (0.23 kg) each week.  The lungs are fully developed.  Patterns of sleep develop.  The fetus's head typically moves into a head-down position (vertex) in the uterus to prepare for birth.  The fetus weighs 6-9 lb (2.72-4.08 kg) and is 19-20 inches (48.26-50.8 cm) long. What can I do to have a healthy pregnancy and help my baby develop? General instructions  Take prenatal vitamins as directed by your health care provider. These include vitamins such as folic acid, iron, calcium, and vitamin D. They are important for healthy development.  Take medicines only as directed by your health care provider. Read labels and ask a pharmacist or your health care provider whether over-the-counter medicines, supplements, and prescription drugs are safe to take during pregnancy.  Keep all follow-up visits as directed by your health care provider. This is important. Follow-up visits include prenatal care and screening tests. How do I know if my baby is developing well? At each prenatal visit,  your health care provider will do several different tests to check on your health and keep track of your baby's development. These include:  Fundal height and position. ? Your health care provider will measure your growing belly from your pubic bone to the top of the uterus using a tape measure. ? Your health care provider will also feel your belly to determine your baby's position.  Heartbeat. ? An ultrasound in the first trimester can confirm pregnancy and show a heartbeat, depending on how far along you are. ? Your health care provider will check your baby's heart rate at every prenatal visit.  Second trimester ultrasound. ? This ultrasound checks your baby's development. It also may show your baby's gender. What should I do if I have concerns about my baby's development? Always talk with your health care provider about any concerns that you may have about your pregnancy and your baby. Summary  A pregnancy usually lasts 280 days, or about 40 weeks. Pregnancy is divided into three periods of growth, also called trimesters.  Your health care provider will monitor your baby's growth and development throughout your pregnancy.  Follow your health care provider's recommendations about taking prenatal vitamins and medicines during your pregnancy.  Talk with your health care provider if you have any concerns about your pregnancy or your developing baby. This information is not intended to replace advice given to you by your health care provider. Make sure you discuss any questions you have with your health care provider. Document Revised: 03/15/2019 Document Reviewed: 10/05/2017 Elsevier Patient Education  [place]. Common Medications Safe in Pregnancy  Acne:      Constipation:  Benzoyl Peroxide     Colace  Clindamycin      Dulcolax Suppository  Topica Erythromycin     Fibercon  Salicylic Acid      Metamucil         Miralax AVOID:         Senakot   Accutane    Cough:  Retin-A       Cough Drops  Tetracycline      Phenergan w/ Codeine if Rx  Minocycline      Robitussin (Plain & DM)  Antibiotics:     Crabs/Lice:  Ceclor       RID  Cephalosporins    AVOID:  E-Mycins      Kwell  Keflex  Macrobid/Macrodantin   Diarrhea:  Penicillin      Kao-Pectate  Zithromax  Imodium AD         PUSH FLUIDS AVOID:       Cipro     Fever:  Tetracycline      Tylenol (Regular or Extra  Minocycline       Strength)  Levaquin      Extra Strength-Do not          Exceed 8 tabs/24 hrs Caffeine:        '200mg'$ /day (equiv. To 1 cup of coffee or  approx. 3 12 oz sodas)         Gas: Cold/Hayfever:       Gas-X  Benadryl      Mylicon  Claritin       Phazyme  **Claritin-D        Chlor-Trimeton    Headaches:  Dimetapp      ASA-Free Excedrin  Drixoral-Non-Drowsy     Cold Compress  Mucinex (Guaifenasin)     Tylenol (Regular or Extra  Sudafed/Sudafed-12 Hour     Strength)  **Sudafed PE Pseudoephedrine   Tylenol Cold & Sinus     Vicks Vapor Rub  Zyrtec  **AVOID if Problems With Blood Pressure         Heartburn: Avoid lying down for at least 1 hour after meals  Aciphex      Maalox     Rash:  Milk of Magnesia     Benadryl    Mylanta       1% Hydrocortisone Cream  Pepcid  Pepcid Complete   Sleep Aids:  Prevacid      Ambien   Prilosec       Benadryl  Rolaids       Chamomile Tea  Tums (Limit 4/day)     Unisom         Tylenol PM         Warm milk-add vanilla or  Hemorrhoids:       Sugar for taste  Anusol/Anusol H.C.  (RX: Analapram 2.5%)  Sugar Substitutes:  Hydrocortisone OTC     Ok in moderation  Preparation H      Tucks        Vaseline lotion applied to tissue with wiping    Herpes:     Throat:  Acyclovir      Oragel  Famvir  Valtrex     Vaccines:         Flu Shot Leg Cramps:       *Gardasil  Benadryl      Hepatitis A         Hepatitis B Nasal Spray:       Pneumovax  Saline Nasal Spray     Polio  Booster         Tetanus Nausea:       Tuberculosis test or PPD  Vitamin B6 25 mg TID   AVOID:    Dramamine      *Gardasil  Emetrol       Live Poliovirus  Ginger Root 250 mg QID    MMR (measles, mumps &  High Complex Carbs @ Bedtime    rebella)  Sea Bands-Accupressure    Varicella (Chickenpox)  Unisom 1/2 tab TID     *No known complications           If received before Pain:         Known pregnancy;   Darvocet       Resume series after  Lortab        Delivery  Percocet  Yeast:   Tramadol      Femstat  Tylenol 3      Gyne-lotrimin  Ultram       Monistat  Vicodin           MISC:         All Sunscreens           Hair Coloring/highlights          Insect Repellant's          (Including DEET)         Mystic Tans

## 2020-11-20 NOTE — Progress Notes (Signed)
I have reviewed the record and concur with patient management and plan of care.    Serafina Royals, CNM [facility], [facility] 11/20/20 9:37 AM

## 2020-11-21 LAB — VARICELLA ZOSTER ANTIBODY, IGG: Varicella zoster IgG: 159 index — ABNORMAL LOW (ref >165)

## 2020-11-21 LAB — RPR: RPR Ser Ql: NONREACTIVE

## 2020-11-21 LAB — CULTURE, OB URINE

## 2020-11-21 LAB — RUBELLA SCREEN: Rubella Antibodies, IGG: 1.09 index (ref >0.99)

## 2020-11-21 LAB — ANTIBODY SCREEN: Antibody Screen: NEGATIVE

## 2020-11-21 LAB — HGB SOLU + RFLX FRAC: Sickle Solubility Test - HGBRFX: NEGATIVE

## 2020-11-21 LAB — HBV PRENATAL SCREEN: Hepatitis B Surface Ag: NEGATIVE

## 2020-11-21 LAB — URINE CULTURE, OB REFLEX

## 2020-11-21 LAB — HIV ANTIBODY (ROUTINE TESTING W REFLEX): HIV Screen 4th Generation wRfx: NONREACTIVE

## 2020-11-23 LAB — GC/CHLAMYDIA PROBE AMP
Chlamydia trachomatis, NAA: NEGATIVE
Neisseria Gonorrhoeae by PCR: NEGATIVE

## 2020-12-01 ENCOUNTER — Encounter: Payer: Self-pay | Admitting: Certified Nurse Midwife

## 2020-12-01 ENCOUNTER — Other Ambulatory Visit: Payer: Self-pay

## 2020-12-01 ENCOUNTER — Ambulatory Visit (INDEPENDENT_AMBULATORY_CARE_PROVIDER_SITE_OTHER): Payer: Medicaid Other | Admitting: Certified Nurse Midwife

## 2020-12-01 VITALS — BP 105/60 | HR 52 | Wt 137.3 lb

## 2020-12-01 DIAGNOSIS — Z130 Encounter for screening for diseases of the blood and blood-forming organs and certain disorders involving the immune mechanism: Secondary | ICD-10-CM

## 2020-12-01 DIAGNOSIS — Z3481 Encounter for supervision of other normal pregnancy, first trimester: Secondary | ICD-10-CM | POA: Diagnosis not present

## 2020-12-01 DIAGNOSIS — Z3A12 12 weeks gestation of pregnancy: Secondary | ICD-10-CM

## 2020-12-01 DIAGNOSIS — Z1379 Encounter for other screening for genetic and chromosomal anomalies: Secondary | ICD-10-CM

## 2020-12-01 NOTE — Progress Notes (Signed)
NEW OB HISTORY AND PHYSICAL  SUBJECTIVE:       Susan Santos is a 19 y.o. G45P1011 female, Patient's last menstrual period was 09/23/2020 (approximate)., Estimated Date of Delivery: 06/13/21, [redacted]w[redacted]d, presents today for establishment of Prenatal Care.  She has no unusual complaints and complains food aversion, fatigue, and intermittent lower back pain.   Desires genetic screening. Prefers postpartum pap smear and midwifery care.   Gynecologic History  Patient's last menstrual period was 09/23/2020 (approximate).   Contraception: none  Last Pap: due  Obstetric History  OB History  Gravida Para Term Preterm AB Living  3 1 1  0 1 1  SAB IAB Ectopic Multiple Live Births  0 0 0 0 1    # Outcome Date GA Lbr Len/2nd Weight Sex Delivery Anes PTL Lv  3 Current           2 AB 2020          1 Term 07/25/17 [redacted]w[redacted]d 14:48 / 04:40 8 lb 6.4 oz (3.81 kg) F Vag-Vacuum None  LIV    Past Medical History:  Diagnosis Date  . Gallstones   . Postpartum anemia 07/26/2017  . Vaginal delivery 2018   girl    Past Surgical History:  Procedure Laterality Date  . CHOLECYSTECTOMY N/A 06/11/2019   Procedure: LAPAROSCOPIC CHOLECYSTECTOMY;  Surgeon: 08/12/2019, MD;  Location: [facility] ORS;  Service: General;  Laterality: N/A;    Current Outpatient Medications on File Prior to Visit  Medication Sig Dispense Refill  . Prenatal Vit-Fe Fumarate-FA (MULTIVITAMIN-PRENATAL) 27-0.8 MG TABS tablet Take 1 tablet by mouth daily at 12 noon.     Current Facility-Administered Medications on File Prior to Visit  Medication Dose Route Frequency Provider Last Rate Last Admin  . medroxyPROGESTERone (DEPO-PROVERA) injection 150 mg  150 mg Intramuscular Q90 days Staples, Jenna L, PA-C   150 mg at 02/22/20 1042    No Known Allergies  Social History   Socioeconomic History  . Marital status: Single    Spouse name: Not on file  . Number of children: 1  . Years of education: Not on file  . Highest education level: Not on  file  Occupational History  . Not on file  Tobacco Use  . Smoking status: Never Smoker  . Smokeless tobacco: Never Used  Vaping Use  . Vaping Use: Never used  Substance and Sexual Activity  . Alcohol use: Not Currently    Comment: occasional  . Drug use: No  . Sexual activity: Yes    Partners: Male    Birth control/protection: None  Other Topics Concern  . Not on file  Social History Narrative   Lives with mom and sister and 25 year old daughter   Social Determinants of Health   Financial Resource Strain: Not on file  Food Insecurity: Not on file  Transportation Needs: Not on file  Physical Activity: Not on file  Stress: Not on file  Social Connections: Not on file  Intimate Partner Violence: Not At Risk  . Fear of Current or Ex-Partner: No  . Emotionally Abused: No  . Physically Abused: No  . Sexually Abused: No    Family History  Problem Relation Age of Onset  . Diabetes Mother   . Breast cancer Neg Hx   . Hypertension Neg Hx     The following portions of the patient's history were reviewed and updated as appropriate: allergies, current medications, past OB history, past medical history, past surgical history, past family history, past  social history, and problem list.  Review of Systems:   ROS negative except as noted above. Information obtained from patient.   OBJECTIVE:  BP 105/60   Pulse (!) 52   Wt 137 lb 4.8 oz (62.3 kg)   LMP 09/23/2020 (Approximate)   BMI 24.32 kg/m   Initial Physical Exam (New OB)  GENERAL APPEARANCE: alert, well appearing, in no apparent distress  HEAD: normocephalic, atraumatic  MOUTH: mucous membranes moist, pharynx normal without lesions  THYROID: no thyromegaly or masses present  BREASTS: no masses noted, no significant tenderness, no palpable axillary nodes, no skin changes  LUNGS: clear to auscultation, no wheezes, rales or rhonchi, symmetric air entry  HEART: regular rate and rhythm, no murmurs  ABDOMEN: soft,  nontender, nondistended, no abnormal masses, no epigastric pain and FHT present  EXTREMITIES: no redness or tenderness in the calves or thighs, no edema  SKIN: normal coloration and turgor, no rashes  NEUROLOGIC: alert, oriented, normal speech, no focal findings or movement disorder noted  PELVIC EXAM: declined by patient  ASSESSMENT: Normal pregnancy Rh positive Desires midwifery care Genetic screening  PLAN: Prenatal care New OB counseling: The patient has been given an overview regarding routine prenatal care. Recommendations regarding diet, weight gain, and exercise in pregnancy were given. Prenatal testing, optional genetic testing, and ultrasound use in pregnancy were reviewed.  Benefits of Breast Feeding were discussed. The patient is encouraged to consider nursing her baby post partum. See orders   Juliann Pares, Student-MidWife  [employer] 12/01/20 3:28 PM

## 2020-12-01 NOTE — Progress Notes (Signed)
Pt present for NOB physical. Pt desires panorama. Requests her sister to be called with panorama results.

## 2020-12-01 NOTE — Patient Instructions (Signed)
Second Trimester of Pregnancy  The second trimester is from week 14 through week 27 (month 4 through 6). This is often the time in pregnancy that you feel your best. Often times, morning sickness has lessened or quit. You may have more energy, and you may get hungry more often. Your unborn baby is growing rapidly. At the end of the sixth month, he or she is about 9 inches long and weighs about 1 pounds. You will likely feel the baby move between 18 and 20 weeks of pregnancy. Follow these instructions at home: Medicines  Take over-the-counter and prescription medicines only as told by your doctor. Some medicines are safe and some medicines are not safe during pregnancy.  Take a prenatal vitamin that contains at least 600 micrograms (mcg) of folic acid.  If you have trouble pooping (constipation), take medicine that will make your stool soft (stool softener) if your doctor approves. Eating and drinking   Eat regular, healthy meals.  Avoid raw meat and uncooked cheese.  If you get low calcium from the food you eat, talk to your doctor about taking a daily calcium supplement.  Avoid foods that are high in fat and sugars, such as fried and sweet foods.  If you feel sick to your stomach (nauseous) or throw up (vomit): ? Eat 4 or 5 small meals a day instead of 3 large meals. ? Try eating a few soda crackers. ? Drink liquids between meals instead of during meals.  To prevent constipation: ? Eat foods that are high in fiber, like fresh fruits and vegetables, whole grains, and beans. ? Drink enough fluids to keep your pee (urine) clear or pale yellow. Activity  Exercise only as told by your doctor. Stop exercising if you start to have cramps.  Do not exercise if it is too hot, too humid, or if you are in a place of great height (high altitude).  Avoid heavy lifting.  Wear low-heeled shoes. Sit and stand up straight.  You can continue to have sex unless your doctor tells you not  to. Relieving pain and discomfort  Wear a good support bra if your breasts are tender.  Take warm water baths (sitz baths) to soothe pain or discomfort caused by hemorrhoids. Use hemorrhoid cream if your doctor approves.  Rest with your legs raised if you have leg cramps or low back pain.  If you develop puffy, bulging veins (varicose veins) in your legs: ? Wear support hose or compression stockings as told by your doctor. ? Raise (elevate) your feet for 15 minutes, 3-4 times a day. ? Limit salt in your food. Prenatal care  Write down your questions. Take them to your prenatal visits.  Keep all your prenatal visits as told by your doctor. This is important. Safety  Wear your seat belt when driving.  Make a list of emergency phone numbers, including numbers for family, friends, the hospital, and police and fire departments. General instructions  Ask your doctor about the right foods to eat or for help finding a counselor, if you need these services.  Ask your doctor about local prenatal classes. Begin classes before month 6 of your pregnancy.  Do not use hot tubs, steam rooms, or saunas.  Do not douche or use tampons or scented sanitary pads.  Do not cross your legs for long periods of time.  Visit your dentist if you have not done so. Use a soft toothbrush to brush your teeth. Floss gently.  Avoid all smoking, herbs,   herbs, and alcohol. Avoid drugs that are not approved by your doctor.  Do not use any products that contain nicotine or tobacco, such as cigarettes and e-cigarettes. If you need help quitting, ask your doctor.  Avoid cat litter boxes and soil used by cats. These carry germs that can cause birth defects in the baby and can cause a loss of your baby (miscarriage) or stillbirth. Contact a doctor if:  You have mild cramps or pressure in your lower belly.  You have pain when you pee (urinate).  You have bad smelling fluid coming from your vagina.  You continue to  feel sick to your stomach (nauseous), throw up (vomit), or have watery poop (diarrhea).  You have a nagging pain in your belly area.  You feel dizzy. Get help right away if:  You have a fever.  You are leaking fluid from your vagina.  You have spotting or bleeding from your vagina.  You have severe belly cramping or pain.  You lose or gain weight rapidly.  You have trouble catching your breath and have chest pain.  You notice sudden or extreme puffiness (swelling) of your face, hands, ankles, feet, or legs.  You have not felt the baby move in over an hour.  You have severe headaches that do not go away when you take medicine.  You have trouble seeing. Summary  The second trimester is from week 14 through week 27 (months 4 through 6). This is often the time in pregnancy that you feel your best.  To take care of yourself and your unborn baby, you will need to eat healthy meals, take medicines only if your doctor tells you to do so, and do activities that are safe for you and your baby.  Call your doctor if you get sick or if you notice anything unusual about your pregnancy. Also, call your doctor if you need help with the right food to eat, or if you want to know what activities are safe for you. This information is not intended to replace advice given to you by your health care provider. Make sure you discuss any questions you have with your health care provider. Document Revised: 03/16/2019 Document Reviewed: 12/28/2016 Elsevier Patient Education  2020 Elsevier Inc.    Round Ligament Pain  The round ligament is a cord of muscle and tissue that helps support the uterus. It can become a source of pain during pregnancy if it becomes stretched or twisted as the baby grows. The pain usually begins in the second trimester (13-28 weeks) of pregnancy, and it can come and go until the baby is delivered. It is not a serious problem, and it does not cause harm to the baby. Round  ligament pain is usually a short, sharp, and pinching pain, but it can also be a dull, lingering, and aching pain. The pain is felt in the lower side of the abdomen or in the groin. It usually starts deep in the groin and moves up to the outside of the hip area. The pain may occur when you:  Suddenly change position, such as quickly going from a sitting to standing position.  Roll over in bed.  Cough or sneeze.  Do physical activity. Follow these instructions at home:   Watch your condition for any changes.  When the pain starts, relax. Then try any of these methods to help with the pain: ? Sitting down. ? Flexing your knees up to your abdomen. ? Lying on your side with  abdomen and another pillow between your legs. ? Sitting in a warm bath for 15-20 minutes or until the pain goes away.  Take over-the-counter and prescription medicines only as told by your health care provider.  Move slowly when you sit down or stand up.  Avoid long walks if they cause pain.  Stop or reduce your physical activities if they cause pain.  Keep all follow-up visits as told by your health care provider. This is important. Contact a health care provider if:  Your pain does not go away with treatment.  You feel pain in your back that you did not have before.  Your medicine is not helping. Get help right away if:  You have a fever or chills.  You develop uterine contractions.  You have vaginal bleeding.  You have nausea or vomiting.  You have diarrhea.  You have pain when you urinate. Summary  Round ligament pain is felt in the lower abdomen or groin. It is usually a short, sharp, and pinching pain. It can also be a dull, lingering, and aching pain.  This pain usually begins in the second trimester (13-28 weeks). It occurs because the uterus is stretching with the growing baby, and it is not harmful to the baby.  You may notice the pain when you suddenly change position,  when you cough or sneeze, or during physical activity.  Relaxing, flexing your knees to your abdomen, lying on one side, or taking a warm bath may help to get rid of the pain.  Get help from your health care provider if the pain does not go away or if you have vaginal bleeding, nausea, vomiting, diarrhea, or painful urination. This information is not intended to replace advice given to you by your health care provider. Make sure you discuss any questions you have with your health care provider. Document Revised: 05/10/2018 Document Reviewed: 05/10/2018 Elsevier Patient Education  2020 Elsevier Inc.  

## 2020-12-01 NOTE — Progress Notes (Signed)
I have seen, interviewed, and examined the patient in conjunction with the [employer] and affirm the diagnosis and management plan.   Gunnar Bulla, CNM [facility], [facility] 12/01/20 4:41 PM

## 2020-12-02 ENCOUNTER — Other Ambulatory Visit: Payer: Medicaid Other

## 2020-12-03 LAB — CBC WITH DIFFERENTIAL/PLATELET
Basophils Absolute: 0 10*3/uL (ref 0.0–0.2)
Basos: 1 %
EOS (ABSOLUTE): 0 10*3/uL (ref 0.0–0.4)
Eos: 1 %
Hematocrit: 35.4 % (ref 34.0–46.6)
Hemoglobin: 11.9 g/dL (ref 11.1–15.9)
Immature Grans (Abs): 0 10*3/uL (ref 0.0–0.1)
Immature Granulocytes: 0 %
Lymphocytes Absolute: 2.1 10*3/uL (ref 0.7–3.1)
Lymphs: 30 %
MCH: 29.9 pg (ref 26.6–33.0)
MCHC: 33.6 g/dL (ref 31.5–35.7)
MCV: 89 fL (ref 79–97)
Monocytes Absolute: 0.4 10*3/uL (ref 0.1–0.9)
Monocytes: 6 %
Neutrophils Absolute: 4.3 10*3/uL (ref 1.4–7.0)
Neutrophils: 62 %
Platelets: 194 10*3/uL (ref 150–450)
RBC: 3.98 x10E6/uL (ref 3.77–5.28)
RDW: 13.1 % (ref 11.7–15.4)
WBC: 6.9 10*3/uL (ref 3.4–10.8)

## 2020-12-03 LAB — MICROSCOPIC EXAMINATION
Casts: NONE SEEN /lpf
Epithelial Cells (non renal): >10 /hpf — AB (ref 0–10)
RBC, Urine: NONE SEEN /hpf (ref 0–2)

## 2020-12-03 LAB — URINALYSIS, ROUTINE W REFLEX MICROSCOPIC
Bilirubin, UA: NEGATIVE
Glucose, UA: NEGATIVE
Ketones, UA: NEGATIVE
Nitrite, UA: NEGATIVE
Protein,UA: NEGATIVE
RBC, UA: NEGATIVE
Specific Gravity, UA: 1.019 (ref 1.005–1.030)
Urobilinogen, Ur: 0.2 mg/dL (ref 0.2–1.0)
pH, UA: 6 (ref 5.0–7.5)

## 2020-12-04 LAB — MONITOR DRUG PROFILE 14(MW)
Amphetamine Scrn, Ur: NEGATIVE ng/mL
BARBITURATE SCREEN URINE: NEGATIVE ng/mL
BENZODIAZEPINE SCREEN, URINE: NEGATIVE ng/mL
Buprenorphine, Urine: NEGATIVE ng/mL
CANNABINOIDS UR QL SCN: NEGATIVE ng/mL
Cocaine (Metab) Scrn, Ur: NEGATIVE ng/mL
Creatinine(Crt), U: 132 mg/dL (ref 20.0–300.0)
Fentanyl, Urine: NEGATIVE pg/mL
Meperidine Screen, Urine: NEGATIVE ng/mL
Methadone Screen, Urine: NEGATIVE ng/mL
OXYCODONE+OXYMORPHONE UR QL SCN: NEGATIVE ng/mL
Opiate Scrn, Ur: NEGATIVE ng/mL
Ph of Urine: 5.7 (ref 4.5–8.9)
Phencyclidine Qn, Ur: NEGATIVE ng/mL
Propoxyphene Scrn, Ur: NEGATIVE ng/mL
SPECIFIC GRAVITY: 1.026
Tramadol Screen, Urine: NEGATIVE ng/mL

## 2020-12-06 NOTE — L&D Delivery Note (Signed)
Delivery Note  2159 In room to see patient, patient requesting epidural. Consent obtained from patient, SVE: 10/100/+1, vertex. AROM moderate meconium stained fluid, moderate amount; receiving nurse and NNP notified.   Effective coached maternal pushing efforts. Bladder emptied while pushing.   Spontaneous vaginal birth of liveborn female infant in right occiput anterior position through single loose nuchal cord at 2217. Skin to skin, delayed cord clamping of three (3) vessel cord, and cord blood sample obtained.APGARs: 8, 9. Weight pending. Receiving nurse and NNP present at bedside.   Pitocin bolus infusing, see MAR. Spontaneous delivery of intact placenta at 2224. First degree perineal laceration repaired with 3-0 vicryl rapide with local anesthesia. Periurethral laceration hemostatic, unrepaired. Uterus firm. Rubra small. EBL: 400 ml. Vault check completed. Counts correct x 2.   Initiate routine postpartum care and orders. Mom to postpartum.  Baby to Couplet care / Skin to Skin.  Patient's mother present at bedside and overjoyed with the birth of "Jackelyn Hoehn".    Serafina Royals, CNM [facility], [facility] 06/07/2021, 10:39 PM

## 2020-12-08 ENCOUNTER — Encounter: Payer: Medicaid Other | Admitting: Certified Nurse Midwife

## 2020-12-16 ENCOUNTER — Telehealth: Payer: Self-pay

## 2020-12-16 NOTE — Telephone Encounter (Signed)
mychart message sent to patient- healthy baby. Would she like to know gender? If so how? Verbally or in envelope.

## 2020-12-29 ENCOUNTER — Other Ambulatory Visit: Payer: Self-pay

## 2020-12-29 ENCOUNTER — Ambulatory Visit (INDEPENDENT_AMBULATORY_CARE_PROVIDER_SITE_OTHER): Payer: Medicaid Other | Admitting: Certified Nurse Midwife

## 2020-12-29 VITALS — BP 105/65 | HR 66 | Wt 135.2 lb

## 2020-12-29 DIAGNOSIS — Z23 Encounter for immunization: Secondary | ICD-10-CM

## 2020-12-29 DIAGNOSIS — Z3A16 16 weeks gestation of pregnancy: Secondary | ICD-10-CM | POA: Diagnosis not present

## 2020-12-29 LAB — POCT URINALYSIS DIPSTICK OB
Bilirubin, UA: NEGATIVE
Blood, UA: NEGATIVE
Glucose, UA: NEGATIVE
Ketones, UA: NEGATIVE
Leukocytes, UA: NEGATIVE
Nitrite, UA: NEGATIVE
POC,PROTEIN,UA: NEGATIVE
Spec Grav, UA: 1.025 (ref 1.010–1.025)
Urobilinogen, UA: 0.2 E.U./dL
pH, UA: 5 (ref 5.0–8.0)

## 2020-12-29 NOTE — Patient Instructions (Signed)

## 2020-12-29 NOTE — Progress Notes (Signed)
ROB doing well. Not feeling movement yet. Discussed anatomy scan next visit. She verbalizes and agrees . Follow up 4 wks with Marcelino Duster.   Doreene Burke, CNM

## 2021-01-29 ENCOUNTER — Other Ambulatory Visit (HOSPITAL_COMMUNITY)
Admission: RE | Admit: 2021-01-29 | Discharge: 2021-01-29 | Disposition: A | Discharge status: Home / Self Care | Payer: Medicaid Other | Source: Ambulatory Visit | Attending: Certified Nurse Midwife | Admitting: Certified Nurse Midwife

## 2021-01-29 ENCOUNTER — Other Ambulatory Visit: Payer: Self-pay

## 2021-01-29 ENCOUNTER — Ambulatory Visit (INDEPENDENT_AMBULATORY_CARE_PROVIDER_SITE_OTHER): Payer: Medicaid Other | Admitting: Certified Nurse Midwife

## 2021-01-29 ENCOUNTER — Encounter: Payer: Self-pay | Admitting: Certified Nurse Midwife

## 2021-01-29 ENCOUNTER — Ambulatory Visit: Payer: Medicaid Other

## 2021-01-29 VITALS — BP 87/57 | HR 61 | Wt 131.5 lb

## 2021-01-29 DIAGNOSIS — O26892 Other specified pregnancy related conditions, second trimester: Secondary | ICD-10-CM | POA: Diagnosis present

## 2021-01-29 DIAGNOSIS — N898 Other specified noninflammatory disorders of vagina: Secondary | ICD-10-CM | POA: Diagnosis present

## 2021-01-29 DIAGNOSIS — R310 Gross hematuria: Secondary | ICD-10-CM

## 2021-01-29 DIAGNOSIS — Z3A20 20 weeks gestation of pregnancy: Secondary | ICD-10-CM

## 2021-01-29 DIAGNOSIS — Z3482 Encounter for supervision of other normal pregnancy, second trimester: Secondary | ICD-10-CM | POA: Insufficient documentation

## 2021-01-29 DIAGNOSIS — N949 Unspecified condition associated with female genital organs and menstrual cycle: Secondary | ICD-10-CM

## 2021-01-29 LAB — POCT URINALYSIS DIPSTICK OB
Appearance: ABNORMAL
Bilirubin, UA: NEGATIVE
Glucose, UA: NEGATIVE
Ketones, UA: NEGATIVE
Nitrite, UA: NEGATIVE
Odor: NORMAL
POC,PROTEIN,UA: NEGATIVE
Spec Grav, UA: 1.02 (ref 1.010–1.025)
Urobilinogen, UA: 0.2 E.U./dL
pH, UA: 6 (ref 5.0–8.0)

## 2021-01-29 MED ORDER — PRENATAL 27-0.8 MG PO TABS: 1.0000 | ORAL_TABLET | Freq: Every day | ORAL | 12 refills | Status: DC

## 2021-01-29 NOTE — Progress Notes (Signed)
ROB: States she is having a greenish discharge, unsure on how to describe it. She needs a refill on her prenatal vitamins. She also wants to breastfeed and is interested in classes. Could not leave urine sample yet.

## 2021-01-29 NOTE — Patient Instructions (Addendum)
Round Ligament Pain  The round ligament is a cord of muscle and tissue that helps support the uterus. It can become a source of pain during pregnancy if it becomes stretched or twisted as the baby grows. The pain usually begins in the second trimester (13-28 weeks) of pregnancy, and it can come and go until the baby is delivered. It is not a serious problem, and it does not cause harm to the baby. Round ligament pain is usually a short, sharp, and pinching pain, but it can also be a dull, lingering, and aching pain. The pain is felt in the lower side of the abdomen or in the groin. It usually starts deep in the groin and moves up to the outside of the hip area. The pain may occur when you:  Suddenly change position, such as quickly going from a sitting to standing position.  Roll over in bed.  Cough or sneeze.  Do physical activity. Follow these instructions at home:  Watch your condition for any changes.  When the pain starts, relax. Then try any of these methods to help with the pain: ? Sitting down. ? Flexing your knees up to your abdomen. ? Lying on your side with one pillow under your abdomen and another pillow between your legs. ? Sitting in a warm bath for 15-20 minutes or until the pain goes away.  Take over-the-counter and prescription medicines only as told by your health care provider.  Move slowly when you sit down or stand up.  Avoid long walks if they cause pain.  Stop or reduce your physical activities if they cause pain.  Keep all follow-up visits as told by your health care provider. This is important.   Contact a health care provider if:  Your pain does not go away with treatment.  You feel pain in your back that you did not have before.  Your medicine is not helping. Get help right away if:  You have a fever or chills.  You develop uterine contractions.  You have vaginal bleeding.  You have nausea or vomiting.  You have diarrhea.  You have pain  when you urinate. Summary  Round ligament pain is felt in the lower abdomen or groin. It is usually a short, sharp, and pinching pain. It can also be a dull, lingering, and aching pain.  This pain usually begins in the second trimester (13-28 weeks). It occurs because the uterus is stretching with the growing baby, and it is not harmful to the baby.  You may notice the pain when you suddenly change position, when you cough or sneeze, or during physical activity.  Relaxing, flexing your knees to your abdomen, lying on one side, or taking a warm bath may help to get rid of the pain.  Get help from your health care provider if the pain does not go away or if you have vaginal bleeding, nausea, vomiting, diarrhea, or painful urination. This information is not intended to replace advice given to you by your health care provider. Make sure you discuss any questions you have with your health care provider. Document Revised: 05/10/2018 Document Reviewed: 05/10/2018 Elsevier Patient Education  2021 Elsevier Inc.     Second Trimester of Pregnancy  The second trimester of pregnancy is from week 13 through week 27. This is also called months 4 through 6 of pregnancy. This is often the time when you feel your best. During the second trimester:  Morning sickness is less or has stopped.  You   may have more energy.  You may feel hungry more often. At this time, your unborn baby (fetus) is growing very fast. At the end of the sixth month, the unborn baby may be up to 12 inches long and weigh about 1 pounds. You will likely start to feel the baby move between 16 and 20 weeks of pregnancy. Body changes during your second trimester Your body continues to go through many changes during this time. The changes vary and generally return to normal after the baby is born. Physical changes  You will gain more weight.  You may start to get stretch marks on your hips, belly (abdomen), and breasts.  Your  breasts will grow and may hurt.  Dark spots or blotches may develop on your face.  A dark line from your belly button to the pubic area (linea nigra) may appear.  You may have changes in your hair. Health changes  You may have headaches.  You may have heartburn.  You may have trouble pooping (constipation).  You may have hemorrhoids or swollen, bulging veins (varicose veins).  Your gums may bleed.  You may pee (urinate) more often.  You may have back pain. Follow these instructions at home: Medicines  Take over-the-counter and prescription medicines only as told by your doctor. Some medicines are not safe during pregnancy.  Take a prenatal vitamin that contains at least 600 micrograms (mcg) of folic acid. Eating and drinking  Eat healthy meals that include: ? Fresh fruits and vegetables. ? Whole grains. ? Good sources of protein, such as meat, eggs, or tofu. ? Low-fat dairy products.  Avoid raw meat and unpasteurized juice, milk, and cheese.  You may need to take these actions to prevent or treat trouble pooping: ? Drink enough fluids to keep your pee (urine) pale yellow. ? Eat foods that are high in fiber. These include beans, whole grains, and fresh fruits and vegetables. ? Limit foods that are high in fat and sugar. These include fried or sweet foods. Activity  Exercise only as told by your doctor. Most people can do their usual exercise during pregnancy. Try to exercise for 30 minutes at least 5 days a week.  Stop exercising if you have pain or cramps in your belly or lower back.  Do not exercise if it is too hot or too humid, or if you are in a place of great height (high altitude).  Avoid heavy lifting.  If you choose to, you may have sex unless your doctor tells you not to. Relieving pain and discomfort  Wear a good support bra if your breasts are sore.  Take warm water baths (sitz baths) to soothe pain or discomfort caused by hemorrhoids. Use  hemorrhoid cream if your doctor approves.  Rest with your legs raised (elevated) if you have leg cramps or low back pain.  If you develop bulging veins in your legs: ? Wear support hose as told by your doctor. ? Raise your feet for 15 minutes, 3-4 times a day. ? Limit salt in your food. Safety  Wear your seat belt at all times when you are in a car.  Talk with your doctor if someone is hurting you or yelling at you a lot. Lifestyle  Do not use hot tubs, steam rooms, or saunas.  Do not douche. Do not use tampons or scented sanitary pads.  Avoid cat litter boxes and soil used by cats. These carry germs that can harm your baby and can cause a loss   of your baby by miscarriage or stillbirth.  Do not use herbal medicines, illegal drugs, or medicines that are not approved by your doctor. Do not drink alcohol.  Do not smoke or use any products that contain nicotine or tobacco. If you need help quitting, ask your doctor. General instructions  Keep all follow-up visits. This is important.  Ask your doctor about local prenatal classes.  Ask your doctor about the right foods to eat or for help finding a counselor. Where to find more information  American Pregnancy Association: americanpregnancy.org  American College of Obstetricians and Gynecologists: www.acog.org  Office on Women's Health: womenshealth.gov/pregnancy Contact a doctor if:  You have a headache that does not go away when you take medicine.  You have changes in how you see, or you see spots in front of your eyes.  You have mild cramps, pressure, or pain in your lower belly.  You continue to feel like you may vomit (nauseous), you vomit, or you have watery poop (diarrhea).  You have bad-smelling fluid coming from your vagina.  You have pain when you pee or your pee smells bad.  You have very bad swelling of your face, hands, ankles, feet, or legs.  You have a fever. Get help right away if:  You are leaking  fluid from your vagina.  You have spotting or bleeding from your vagina.  You have very bad belly cramping or pain.  You have trouble breathing.  You have chest pain.  You faint.  You have not felt your baby move for the time period told by your doctor.  You have new or increased pain, swelling, or redness in an arm or leg. Summary  The second trimester of pregnancy is from week 13 through week 27 (months 4 through 6).  Eat healthy meals.  Exercise as told by your doctor. Most people can do their usual exercise during pregnancy.  Do not use herbal medicines, illegal drugs, or medicines that are not approved by your doctor. Do not drink alcohol.  Call your doctor if you get sick or if you notice anything unusual about your pregnancy. This information is not intended to replace advice given to you by your health care provider. Make sure you discuss any questions you have with your health care provider. Document Revised: 04/30/2020 Document Reviewed: 03/06/2020 Elsevier Patient Education  2021 Elsevier Inc.  

## 2021-01-29 NOTE — Progress Notes (Signed)
I have seen, interviewed, and examined the patient in conjunction with the [employer] and affirm the diagnosis and management plan.   Gunnar Bulla, CNM [facility], [facility] 01/29/21 10:39 AM

## 2021-01-29 NOTE — Progress Notes (Signed)
ROB- Reports green vaginal discharge and round ligament pain; vaginal swab and urine culture collected today, see orders. Discussed home treatment measures, see avs. Prenatal vitamins refilled per patient request, see MAR. Anticipatory guidance regarding course of prenatal care. Reviewed red flag symptoms and when to call. RTC on 3/1 for ANATOMY scan as previously scheduled and ROB with ANNIE or sooner if needed.   Juliann Pares, Student-MidWife [employer] 01/29/21 10:35 AM

## 2021-01-30 ENCOUNTER — Other Ambulatory Visit: Payer: Self-pay | Admitting: Certified Nurse Midwife

## 2021-01-30 ENCOUNTER — Telehealth: Payer: Self-pay

## 2021-01-30 DIAGNOSIS — B373 Candidiasis of vulva and vagina: Secondary | ICD-10-CM

## 2021-01-30 DIAGNOSIS — B9689 Other specified bacterial agents as the cause of diseases classified elsewhere: Secondary | ICD-10-CM

## 2021-01-30 DIAGNOSIS — B3731 Acute candidiasis of vulva and vagina: Secondary | ICD-10-CM

## 2021-01-30 LAB — CERVICOVAGINAL ANCILLARY ONLY
Bacterial Vaginitis (gardnerella): POSITIVE — AB
Candida Glabrata: NEGATIVE
Candida Vaginitis: POSITIVE — AB
Chlamydia: NEGATIVE
Comment: NEGATIVE
Comment: NEGATIVE
Comment: NEGATIVE
Comment: NEGATIVE
Comment: NEGATIVE
Comment: NORMAL
Neisseria Gonorrhea: NEGATIVE
Trichomonas: NEGATIVE

## 2021-01-30 MED ORDER — METRONIDAZOLE 500 MG PO TABS: 500.0000 mg | ORAL_TABLET | Freq: Two times a day (BID) | ORAL | 0 refills | Status: AC

## 2021-01-30 MED ORDER — TERCONAZOLE 0.4 % VA CREA: 1.0000 | TOPICAL_CREAM | Freq: Every day | VAGINAL | 0 refills | Status: DC

## 2021-01-30 NOTE — Progress Notes (Signed)
Rx Flagyl and Terazol, see orders.    Serafina Royals, CNM [facility], [facility] 01/30/21 1:39 PM

## 2021-01-30 NOTE — Telephone Encounter (Signed)
Called pt to inform her about her ultrasound, told her that she will need to go to the hospital for the ultrasounds and they come over to our office at 3 pm for her visit with Pattricia Boss. Pt verbally under stood

## 2021-01-31 LAB — URINE CULTURE

## 2021-02-03 ENCOUNTER — Encounter: Payer: Medicaid Other | Admitting: Certified Nurse Midwife

## 2021-02-03 ENCOUNTER — Other Ambulatory Visit: Payer: Medicaid Other

## 2021-02-05 ENCOUNTER — Other Ambulatory Visit: Payer: Self-pay | Admitting: Certified Nurse Midwife

## 2021-02-05 DIAGNOSIS — Z3689 Encounter for other specified antenatal screening: Secondary | ICD-10-CM

## 2021-02-05 DIAGNOSIS — Z3A22 22 weeks gestation of pregnancy: Secondary | ICD-10-CM

## 2021-02-09 ENCOUNTER — Encounter: Payer: Self-pay | Admitting: Certified Nurse Midwife

## 2021-02-09 ENCOUNTER — Ambulatory Visit (INDEPENDENT_AMBULATORY_CARE_PROVIDER_SITE_OTHER): Payer: Medicaid Other | Admitting: Certified Nurse Midwife

## 2021-02-09 ENCOUNTER — Ambulatory Visit
Admission: RE | Admit: 2021-02-09 | Discharge: 2021-02-09 | Disposition: A | Discharge status: Home / Self Care | Payer: Medicaid Other | Source: Ambulatory Visit | Attending: Certified Nurse Midwife | Admitting: Certified Nurse Midwife

## 2021-02-09 ENCOUNTER — Ambulatory Visit: Payer: Medicaid Other

## 2021-02-09 ENCOUNTER — Other Ambulatory Visit: Payer: Self-pay

## 2021-02-09 VITALS — BP 106/67 | HR 54 | Wt 132.6 lb

## 2021-02-09 DIAGNOSIS — Z3482 Encounter for supervision of other normal pregnancy, second trimester: Secondary | ICD-10-CM

## 2021-02-09 DIAGNOSIS — Z3689 Encounter for other specified antenatal screening: Secondary | ICD-10-CM | POA: Diagnosis present

## 2021-02-09 DIAGNOSIS — Z3A22 22 weeks gestation of pregnancy: Secondary | ICD-10-CM | POA: Insufficient documentation

## 2021-02-09 LAB — POCT URINALYSIS DIPSTICK OB
Bilirubin, UA: NEGATIVE
Blood, UA: NEGATIVE
Glucose, UA: NEGATIVE
Ketones, UA: NEGATIVE
Leukocytes, UA: NEGATIVE
Nitrite, UA: NEGATIVE
POC,PROTEIN,UA: NEGATIVE
Spec Grav, UA: <=1.005 — AB (ref 1.010–1.025)
Urobilinogen, UA: 0.2 E.U./dL
pH, UA: 6 (ref 5.0–8.0)

## 2021-02-09 NOTE — Progress Notes (Signed)
ROB doing well. Had anatomy u/s today, results not in at this time. Will follow up with pt via my chart with results when they are available. Discussed normal musculoskeletal discomforts of pregnancy. Discussed self help measures. Follow up 3 wk with Marcelino Duster for ROB.   Doreene Burke, CNM

## 2021-02-09 NOTE — Patient Instructions (Signed)

## 2021-03-02 ENCOUNTER — Other Ambulatory Visit: Payer: Self-pay

## 2021-03-02 ENCOUNTER — Ambulatory Visit (INDEPENDENT_AMBULATORY_CARE_PROVIDER_SITE_OTHER): Payer: Medicaid Other | Admitting: Certified Nurse Midwife

## 2021-03-02 ENCOUNTER — Encounter: Payer: Self-pay | Admitting: Certified Nurse Midwife

## 2021-03-02 VITALS — BP 103/65 | HR 56 | Wt 138.2 lb

## 2021-03-02 DIAGNOSIS — Z3A25 25 weeks gestation of pregnancy: Secondary | ICD-10-CM

## 2021-03-02 DIAGNOSIS — Z3402 Encounter for supervision of normal first pregnancy, second trimester: Secondary | ICD-10-CM

## 2021-03-02 DIAGNOSIS — Z3492 Encounter for supervision of normal pregnancy, unspecified, second trimester: Secondary | ICD-10-CM

## 2021-03-02 LAB — POCT URINALYSIS DIPSTICK OB
Bilirubin, UA: NEGATIVE
Blood, UA: NEGATIVE
Glucose, UA: NEGATIVE
Ketones, UA: NEGATIVE
Nitrite, UA: NEGATIVE
POC,PROTEIN,UA: NEGATIVE
Spec Grav, UA: 1.025 (ref 1.010–1.025)
Urobilinogen, UA: 0.2 E.U./dL
pH, UA: 6.5 (ref 5.0–8.0)

## 2021-03-02 NOTE — Progress Notes (Signed)
ROB- Doing well overall, reports mild heartburn relieved by home treatment measures. Anatomy scan results wnl with anterior placenta, findings discussed and reassurance provided. Anticipatory guidance regarding course of prenatal care. Reviewed red flag symptoms and when to call. RTC x 3 weeks for 28 week labs and ROB with ANNIE or sooner if needed.  Juliann Pares, Student-MidWife [employer] 03/02/21 1:02 PM

## 2021-03-02 NOTE — Patient Instructions (Signed)
WHAT OB PATIENTS CAN EXPECT   Confirmation of pregnancy and ultrasound ordered if medically indicated-[redacted] weeks gestation  New OB (NOB) intake with nurse and New OB (NOB) labs- [redacted] weeks gestation  New OB (NOB) physical examination with provider- 11/[redacted] weeks gestation  Flu vaccine-[redacted] weeks gestation  Anatomy scan-[redacted] weeks gestation  Glucose tolerance test, blood work to test for anemia, T-dap vaccine-[redacted] weeks gestation  Vaginal swabs/cultures-STD/Group B strep-[redacted] weeks gestation  Appointments every 4 weeks until 28 weeks  Every 2 weeks from 28 weeks until 36 weeks  Weekly visits from 82 weeks until delivery  Third Trimester of Pregnancy  The third trimester of pregnancy is from week 28 through week 55. This is also called months 7 through 9. This trimester is when your unborn baby (fetus) is growing very fast. At the end of the ninth month, the unborn baby is about 20 inches long. It weighs about 6-10 pounds. Body changes during your third trimester Your body continues to go through many changes during this time. The changes vary and generally return to normal after the baby is born. Physical changes  Your weight will continue to increase. You may gain 25-35 pounds (11-16 kg) by the end of the pregnancy. If you are underweight, you may gain 28-40 lb (about 13-18 kg). If you are overweight, you may gain 15-25 lb (about 7-11 kg).  You may start to get stretch marks on your hips, belly (abdomen), and breasts.  Your breasts will continue to grow and may hurt. A yellow fluid (colostrum) may leak from your breasts. This is the first milk you are making for your baby.  You may have changes in your hair.  Your belly button may stick out.  You may have more swelling in your hands, face, or ankles. Health changes  You may have heartburn.  You may have trouble pooping (constipation).  You may get hemorrhoids. These are swollen veins in the butt that can itch or get painful.  You may  have swollen veins (varicose veins) in your legs.  You may have more body aches in the pelvis, back, or thighs.  You may have more tingling or numbness in your hands, arms, and legs. The skin on your belly may also feel numb.  You may feel short of breath as your womb (uterus) gets bigger. Other changes  You may pee (urinate) more often.  You may have more problems sleeping.  You may notice the unborn baby "dropping," or moving lower in your belly.  You may have more discharge coming from your vagina.  Your joints may feel loose, and you may have pain around your pelvic bone. Follow these instructions at home: Medicines  Take over-the-counter and prescription medicines only as told by your doctor. Some medicines are not safe during pregnancy.  Take a prenatal vitamin that contains at least 600 micrograms (mcg) of folic acid. Eating and drinking  Eat healthy meals that include: ? Fresh fruits and vegetables. ? Whole grains. ? Good sources of protein, such as meat, eggs, or tofu. ? Low-fat dairy products.  Avoid raw meat and unpasteurized juice, milk, and cheese. These carry germs that can harm you and your baby.  Eat 4 or 5 small meals rather than 3 large meals a day.  You may need to take these actions to prevent or treat trouble pooping: ? Drink enough fluids to keep your pee (urine) pale yellow. ? Eat foods that are high in fiber. These include beans, whole grains, and  fresh fruits and vegetables. ? Limit foods that are high in fat and sugar. These include fried or sweet foods. Activity  Exercise only as told by your doctor. Stop exercising if you start to have cramps in your womb.  Avoid heavy lifting.  Do not exercise if it is too hot or too humid, or if you are in a place of great height (high altitude).  If you choose to, you may have sex unless your doctor tells you not to. Relieving pain and discomfort  Take breaks often, and rest with your legs raised  (elevated) if you have leg cramps or low back pain.  Take warm water baths (sitz baths) to soothe pain or discomfort caused by hemorrhoids. Use hemorrhoid cream if your doctor approves.  Wear a good support bra if your breasts are tender.  If you develop bulging, swollen veins in your legs: ? Wear support hose as told by your doctor. ? Raise your feet for 15 minutes, 3-4 times a day. ? Limit salt in your food. Safety  Talk to your doctor before traveling far distances.  Do not use hot tubs, steam rooms, or saunas.  Wear your seat belt at all times when you are in a car.  Talk with your doctor if someone is hurting you or yelling at you a lot. Preparing for your baby's arrival To prepare for the arrival of your baby:  Take prenatal classes.  Visit the hospital and tour the maternity area.  Buy a rear-facing car seat. Learn how to install it in your car.  Prepare the baby's room. Take out all pillows and stuffed animals from the baby's crib. General instructions  Avoid cat litter boxes and soil used by cats. These carry germs that can cause harm to the baby and can cause a loss of your baby by miscarriage or stillbirth.  Do not douche or use tampons. Do not use scented sanitary pads.  Do not smoke or use any products that contain nicotine or tobacco. If you need help quitting, ask your doctor.  Do not drink alcohol.  Do not use herbal medicines, illegal drugs, or medicines that were not approved by your doctor. Chemicals in these products can affect your baby.  Keep all follow-up visits. This is important. Where to find more information  American Pregnancy Association: americanpregnancy.org  [employer]: www.acog.org  Office on Women's Health: KeywordPortfolios.com.br Contact a doctor if:  You have a fever.  You have mild cramps or pressure in your lower belly.  You have a nagging pain in your belly area.  You vomit, or  you have watery poop (diarrhea).  You have bad-smelling fluid coming from your vagina.  You have pain when you pee, or your pee smells bad.  You have a headache that does not go away when you take medicine.  You have changes in how you see, or you see spots in front of your eyes. Get help right away if:  Your water breaks.  You have regular contractions that are less than 5 minutes apart.  You are spotting or bleeding from your vagina.  You have very bad belly cramps or pain.  You have trouble breathing.  You have chest pain.  You faint.  You have not felt the baby move for the amount of time told by your doctor.  You have new or increased pain, swelling, or redness in an arm or leg. Summary  The third trimester is from week 28 through  week 40 (months 7 through 9). This is the time when your unborn baby is growing very fast.  During this time, your discomfort may increase as you gain weight and as your baby grows.  Get ready for your baby to arrive by taking prenatal classes, buying a rear-facing car seat, and preparing the baby's room.  Get help right away if you are bleeding from your vagina, you have chest pain and trouble breathing, or you have not felt the baby move for the amount of time told by your doctor. This information is not intended to replace advice given to you by your health care provider. Make sure you discuss any questions you have with your health care provider. Document Revised: 04/30/2020 Document Reviewed: 03/06/2020 Elsevier Patient Education  [place]. Common Medications Safe in Pregnancy  Acne:      Constipation:  Benzoyl Peroxide     Colace  Clindamycin      Dulcolax Suppository  Topica Erythromycin     Fibercon  Salicylic Acid      Metamucil         Miralax AVOID:        Senakot   Accutane    Cough:  Retin-A       Cough Drops  Tetracycline      Phenergan w/ Codeine if Rx  Minocycline      Robitussin (Plain &  DM)  Antibiotics:     Crabs/Lice:  Ceclor       RID  Cephalosporins    AVOID:  E-Mycins      Kwell  Keflex  Macrobid/Macrodantin   Diarrhea:  Penicillin      Kao-Pectate  Zithromax      Imodium AD         PUSH FLUIDS AVOID:       Cipro     Fever:  Tetracycline      Tylenol (Regular or Extra  Minocycline       Strength)  Levaquin      Extra Strength-Do not          Exceed 8 tabs/24 hrs Caffeine:        <276m/day (equiv. To 1 cup of coffee or  approx. 3 12 oz sodas)         Gas: Cold/Hayfever:       Gas-X  Benadryl      Mylicon  Claritin       Phazyme  **Claritin-D        Chlor-Trimeton    Headaches:  Dimetapp      ASA-Free Excedrin  Drixoral-Non-Drowsy     Cold Compress  Mucinex (Guaifenasin)     Tylenol (Regular or Extra  Sudafed/Sudafed-12 Hour     Strength)  **Sudafed PE Pseudoephedrine   Tylenol Cold & Sinus     Vicks Vapor Rub  Zyrtec  **AVOID if Problems With Blood Pressure         Heartburn: Avoid lying down for at least 1 hour after meals  Aciphex      Maalox     Rash:  Milk of Magnesia     Benadryl    Mylanta       1% Hydrocortisone Cream  Pepcid  Pepcid Complete   Sleep Aids:  Prevacid      Ambien   Prilosec       Benadryl  Rolaids       Chamomile Tea  Tums (Limit 4/day)     Unisom  Tylenol PM         Warm milk-add vanilla or  Hemorrhoids:       Sugar for taste  Anusol/Anusol H.C.  (RX: Analapram 2.5%)  Sugar Substitutes:  Hydrocortisone OTC     Ok in moderation  Preparation H      Tucks        Vaseline lotion applied to tissue with wiping    Herpes:     Throat:  Acyclovir      Oragel  Famvir  Valtrex     Vaccines:         Flu Shot Leg Cramps:       *Gardasil  Benadryl      Hepatitis A         Hepatitis B Nasal Spray:       Pneumovax  Saline Nasal Spray     Polio Booster         Tetanus Nausea:       Tuberculosis test or PPD  Vitamin B6 25 mg TID   AVOID:    Dramamine      *Gardasil  Emetrol       Live Poliovirus  Ginger  Root 250 mg QID    MMR (measles, mumps &  High Complex Carbs @ Bedtime    rebella)  Sea Bands-Accupressure    Varicella (Chickenpox)  Unisom 1/2 tab TID     *No known complications           If received before Pain:         Known pregnancy;   Darvocet       Resume series after  Lortab        Delivery  Percocet    Yeast:   Tramadol      Femstat  Tylenol 3      Gyne-lotrimin  Ultram       Monistat  Vicodin           MISC:         All Sunscreens           Hair Coloring/highlights          Insect Repellant's          (Including DEET)         Mystic Tans  

## 2021-03-02 NOTE — Progress Notes (Signed)
I have seen, interviewed, and examined the patient in conjunction with the [facility] [employer] [facility] and affirm the diagnosis and management plan.   Gunnar Bulla, CNM [facility], [facility] 03/02/21 1:26 PM

## 2021-03-02 NOTE — Progress Notes (Signed)
OB-Pt present for routine prenatal care. Pt stated that she was doing well no problems.  

## 2021-03-18 ENCOUNTER — Other Ambulatory Visit: Payer: Medicaid Other

## 2021-03-18 ENCOUNTER — Encounter: Payer: Medicaid Other | Admitting: Certified Nurse Midwife

## 2021-03-23 ENCOUNTER — Encounter: Payer: Self-pay | Admitting: Certified Nurse Midwife

## 2021-03-23 ENCOUNTER — Ambulatory Visit (INDEPENDENT_AMBULATORY_CARE_PROVIDER_SITE_OTHER): Payer: Medicaid Other | Admitting: Certified Nurse Midwife

## 2021-03-23 ENCOUNTER — Other Ambulatory Visit: Payer: Medicaid Other

## 2021-03-23 ENCOUNTER — Other Ambulatory Visit: Payer: Self-pay

## 2021-03-23 VITALS — BP 103/63 | HR 83 | Wt 141.0 lb

## 2021-03-23 DIAGNOSIS — Z3403 Encounter for supervision of normal first pregnancy, third trimester: Secondary | ICD-10-CM

## 2021-03-23 DIAGNOSIS — Z23 Encounter for immunization: Secondary | ICD-10-CM | POA: Diagnosis not present

## 2021-03-23 DIAGNOSIS — Z3492 Encounter for supervision of normal pregnancy, unspecified, second trimester: Secondary | ICD-10-CM

## 2021-03-23 DIAGNOSIS — Z3A28 28 weeks gestation of pregnancy: Secondary | ICD-10-CM

## 2021-03-23 LAB — POCT URINALYSIS DIPSTICK OB
Bilirubin, UA: NEGATIVE
Blood, UA: NEGATIVE
Glucose, UA: NEGATIVE
Ketones, UA: NEGATIVE
Nitrite, UA: NEGATIVE
POC,PROTEIN,UA: NEGATIVE
Spec Grav, UA: 1.02 (ref 1.010–1.025)
Urobilinogen, UA: 0.2 E.U./dL
pH, UA: 6 (ref 5.0–8.0)

## 2021-03-23 MED ORDER — TETANUS-DIPHTH-ACELL PERTUSSIS 5-2.5-18.5 LF-MCG/0.5 IM SUSY
0.5000 mL | PREFILLED_SYRINGE | Freq: Once | INTRAMUSCULAR | Status: AC
  Administered 2021-03-23: 0.5 mL via INTRAMUSCULAR

## 2021-03-23 NOTE — Patient Instructions (Signed)
Td (Tetanus, Diphtheria) Vaccine: What You Need to Know 1. Why get vaccinated? Td vaccine can prevent tetanus and diphtheria. Tetanus enters the body through cuts or wounds. Diphtheria spreads from person to person.  TETANUS (T) causes painful stiffening of the muscles. Tetanus can lead to serious health problems, including being unable to open the mouth, having trouble swallowing and breathing, or death.  DIPHTHERIA (D) can lead to difficulty breathing, heart failure, paralysis, or death. 2. Td vaccine Td is only for children 7 years and older, adolescents, and adults.  Td is usually given as a booster dose every 10 years, or after 5 years in the case of a severe or dirty wound or burn. Another vaccine, called "Tdap," may be used instead of Td. Tdap protects against pertussis, also known as "whooping cough," in addition to tetanus and diphtheria. Td may be given at the same time as other vaccines. 3. Talk with your health care provider Tell your vaccination provider if the person getting the vaccine:  Has had an allergic reaction after a previous dose of any vaccine that protects against tetanus or diphtheria, or has any severe, life-threatening allergies  Has ever had Guillain-Barr Syndrome (also called "GBS")  Has had severe pain or swelling after a previous dose of any vaccine that protects against tetanus or diphtheria In some cases, your health care provider may decide to postpone Td vaccination until a future visit. People with minor illnesses, such as a cold, may be vaccinated. People who are moderately or severely ill should usually wait until they recover before getting Td vaccine.  Your health care provider can give you more information. 4. Risks of a vaccine reaction  Pain, redness, or swelling where the shot was given, mild fever, headache, feeling tired, and nausea, vomiting, diarrhea, or stomachache sometimes happen after Td vaccination. People sometimes faint after medical  procedures, including vaccination. Tell your provider if you feel dizzy or have vision changes or ringing in the ears.  As with any medicine, there is a very remote chance of a vaccine causing a severe allergic reaction, other serious injury, or death. 5. What if there is a serious problem? An allergic reaction could occur after the vaccinated person leaves the clinic. If you see signs of a severe allergic reaction (hives, swelling of the face and throat, difficulty breathing, a fast heartbeat, dizziness, or weakness), call 9-1-1 and get the person to the nearest hospital.  For other signs that concern you, call your health care provider.  Adverse reactions should be reported to the Vaccine Adverse Event Reporting System (VAERS). Your health care provider will usually file this report, or you can do it yourself. Visit the VAERS website at www.vaers.hhs.gov or call 1-800-822-7967. VAERS is only for reporting reactions, and VAERS staff members do not give medical advice. 6. The National Vaccine Injury Compensation Program The National Vaccine Injury Compensation Program (VICP) is a federal program that was created to compensate people who may have been injured by certain vaccines. Claims regarding alleged injury or death due to vaccination have a time limit for filing, which may be as short as two years. Visit the VICP website at www.hrsa.gov/vaccinecompensation or call 1-800-338-2382 to learn about the program and about filing a claim. 7. How can I learn more?  Ask your health care provider.  Call your local or state health department.  Visit the website of the Food and Drug Administration (FDA) for vaccine package inserts and additional information at www.fda.gov/vaccines-blood-biologics/vaccines.  Contact the Centers for   Disease Control and Prevention (CDC): ? Call 1-800-232-4636 (1-800-CDC-INFO) or ? Visit CDC's website at www.cdc.gov/vaccines. Vaccine Information Statement Td (Tetanus,  Diphtheria) Vaccine (07/11/2020) This information is not intended to replace advice given to you by your health care provider. Make sure you discuss any questions you have with your health care provider. Document Revised: 08/28/2020 Document Reviewed: 08/28/2020 Elsevier Patient Education  2021 Elsevier Inc.  

## 2021-03-23 NOTE — Progress Notes (Signed)
OB-Pt present for routine prenatal care. Pt had 1 hour glucose, tdap vaccine and 28 week labs. Pt stated having vaginal pain and pressure.

## 2021-03-23 NOTE — Progress Notes (Signed)
ROB doing well, feels good fetal movement. 28 wk labs today: CBC/glucose/RPR. BTC signed/Tdap given . Discussed RSB see check list for topics reviewed. Discussed birth plan, sample given. Discussed birth control after delivery, pamphlet given. Follow up 2 wk with Marcelino Duster.   Doreene Burke, CN M

## 2021-03-24 LAB — CBC
Hematocrit: 33.9 % — ABNORMAL LOW (ref 34.0–46.6)
Hemoglobin: 11.5 g/dL (ref 11.1–15.9)
MCH: 31 pg (ref 26.6–33.0)
MCHC: 33.9 g/dL (ref 31.5–35.7)
MCV: 91 fL (ref 79–97)
Platelets: 197 10*3/uL (ref 150–450)
RBC: 3.71 x10E6/uL — ABNORMAL LOW (ref 3.77–5.28)
RDW: 12 % (ref 11.7–15.4)
WBC: 9.2 10*3/uL (ref 3.4–10.8)

## 2021-03-24 LAB — RPR: RPR Ser Ql: NONREACTIVE

## 2021-03-24 LAB — GLUCOSE, 1 HOUR GESTATIONAL: Gestational Diabetes Screen: 104 mg/dL (ref 65–139)

## 2021-04-06 ENCOUNTER — Other Ambulatory Visit: Payer: Self-pay

## 2021-04-06 ENCOUNTER — Ambulatory Visit (INDEPENDENT_AMBULATORY_CARE_PROVIDER_SITE_OTHER): Payer: Medicaid Other | Admitting: Certified Nurse Midwife

## 2021-04-06 ENCOUNTER — Encounter: Payer: Self-pay | Admitting: Certified Nurse Midwife

## 2021-04-06 VITALS — BP 107/66 | HR 92 | Wt 146.1 lb

## 2021-04-06 DIAGNOSIS — Z3A30 30 weeks gestation of pregnancy: Secondary | ICD-10-CM

## 2021-04-06 DIAGNOSIS — Z3403 Encounter for supervision of normal first pregnancy, third trimester: Secondary | ICD-10-CM

## 2021-04-06 DIAGNOSIS — Z3483 Encounter for supervision of other normal pregnancy, third trimester: Secondary | ICD-10-CM

## 2021-04-06 DIAGNOSIS — M549 Dorsalgia, unspecified: Secondary | ICD-10-CM

## 2021-04-06 DIAGNOSIS — O99891 Other specified diseases and conditions complicating pregnancy: Secondary | ICD-10-CM

## 2021-04-06 LAB — POCT URINALYSIS DIPSTICK OB
Bilirubin, UA: NEGATIVE
Blood, UA: NEGATIVE
Glucose, UA: NEGATIVE
Ketones, UA: NEGATIVE
Nitrite, UA: NEGATIVE
Spec Grav, UA: 1.01 (ref 1.010–1.025)
Urobilinogen, UA: 0.2 E.U./dL
pH, UA: 8 (ref 5.0–8.0)

## 2021-04-06 NOTE — Patient Instructions (Addendum)
Back Pain in Pregnancy Back pain during pregnancy is common. Back pain may be caused by several factors that are related to changes during your pregnancy. Follow these instructions at home: Managing pain, stiffness, and swelling  If directed, for sudden (acute) back pain, put ice on the painful area. ? Put ice in a plastic bag. ? Place a towel between your skin and the bag. ? Leave the ice on for 20 minutes, 2-3 times per day.  If directed, apply heat to the affected area before you exercise. Use the heat source that your health care provider recommends, such as a moist heat pack or a heating pad. ? Place a towel between your skin and the heat source. ? Leave the heat on for 20-30 minutes. ? Remove the heat if your skin turns bright red. This is especially important if you are unable to feel pain, heat, or cold. You may have a greater risk of getting burned.  If directed, massage the affected area.      Activity  Exercise as told by your health care provider. Gentle exercise is the best way to prevent or manage back pain.  Listen to your body when lifting. If lifting hurts, ask for help or bend your knees. This uses your leg muscles instead of your back muscles.  Squat down when picking up something from the floor. Do not bend over.  Only use bed rest for short periods as told by your health care provider. Bed rest should only be used for the most severe episodes of back pain. Standing, sitting, and lying down  Do not stand in one place for long periods of time.  Use good posture when sitting. Make sure your head rests over your shoulders and is not hanging forward. Use a pillow on your lower back if necessary.  Try sleeping on your side, preferably the left side, with a pregnancy support pillow or 1-2 regular pillows between your legs. ? If you have back pain after a night's rest, your bed may be too soft. ? A firm mattress may provide more support for your back during  pregnancy. General instructions  Do not wear high heels.  Eat a healthy diet. Try to gain weight within your health care provider's recommendations.  Use a maternity girdle, elastic sling, or back brace as told by your health care provider.  Take over-the-counter and prescription medicines only as told by your health care provider.  Work with a physical therapist or massage therapist to find ways to manage back pain. Acupuncture or massage therapy may be helpful.  Keep all follow-up visits as told by your health care provider. This is important. Contact a health care provider if:  Your back pain interferes with your daily activities.  You have increasing pain in other parts of your body. Get help right away if:  You develop numbness, tingling, weakness, or problems with the use of your arms or legs.  You develop severe back pain that is not controlled with medicine.  You have a change in bowel or bladder control.  You develop shortness of breath, dizziness, or you faint.  You develop nausea, vomiting, or sweating.  You have back pain that is a rhythmic, cramping pain similar to labor pains. Labor pain is usually 1-2 minutes apart, lasts for about 1 minute, and involves a bearing down feeling or pressure in your pelvis.  You have back pain and your water breaks or you have vaginal bleeding.  You have back pain or   numbness that travels down your leg.  Your back pain developed after you fell.  You develop pain on one side of your back.  You see blood in your urine.  You develop skin blisters in the area of your back pain. Summary  Back pain may be caused by several factors that are related to changes during your pregnancy.  Follow instructions as told by your health care provider for managing pain, stiffness, and swelling.  Exercise as told by your health care provider. Gentle exercise is the best way to prevent or manage back pain.  Take over-the-counter and  prescription medicines only as told by your health care provider.  Keep all follow-up visits as told by your health care provider. This is important. This information is not intended to replace advice given to you by your health care provider. Make sure you discuss any questions you have with your health care provider. Document Revised: 03/13/2019 Document Reviewed: 05/10/2018 Elsevier Patient Education  2021 Elsevier Inc.   Fetal Movement Counts Patient Name: ________________________________________________ Patient Due Date: ____________________  What is a fetal movement count? A fetal movement count is the number of times that you feel your baby move during a certain amount of time. This may also be called a fetal kick count. A fetal movement count is recommended for every pregnant woman. You may be asked to start counting fetal movements as early as week 28 of your pregnancy. Pay attention to when your baby is most active. You may notice your baby's sleep and wake cycles. You may also notice things that make your baby move more. You should do a fetal movement count:  When your baby is normally most active.  At the same time each day. A good time to count movements is while you are resting, after having something to eat and drink. How do I count fetal movements? 1. Find a quiet, comfortable area. Sit, or lie down on your side. 2. Write down the date, the start time and stop time, and the number of movements that you felt between those two times. Take this information with you to your health care visits. 3. Write down your start time when you feel the first movement. 4. Count kicks, flutters, swishes, rolls, and jabs. You should feel at least 10 movements. 5. You may stop counting after you have felt 10 movements, or if you have been counting for 2 hours. Write down the stop time. 6. If you do not feel 10 movements in 2 hours, contact your health care provider for further instructions.  Your health care provider may want to do additional tests to assess your baby's well-being. Contact a health care provider if:  You feel fewer than 10 movements in 2 hours.  Your baby is not moving like he or she usually does. Date: ____________ Start time: ____________ Stop time: ____________ Movements: ____________ Date: ____________ Start time: ____________ Stop time: ____________ Movements: ____________ Date: ____________ Start time: ____________ Stop time: ____________ Movements: ____________ Date: ____________ Start time: ____________ Stop time: ____________ Movements: ____________ Date: ____________ Start time: ____________ Stop time: ____________ Movements: ____________ Date: ____________ Start time: ____________ Stop time: ____________ Movements: ____________ Date: ____________ Start time: ____________ Stop time: ____________ Movements: ____________ Date: ____________ Start time: ____________ Stop time: ____________ Movements: ____________ Date: ____________ Start time: ____________ Stop time: ____________ Movements: ____________ This information is not intended to replace advice given to you by your health care provider. Make sure you discuss any questions you have with your health care  provider. Document Revised: 07/12/2019 Document Reviewed: 07/12/2019 Elsevier Patient Education  2021 [employer]   Third Trimester of Pregnancy  The third trimester of pregnancy is from week 28 through week 40. This is also called months 7 through 9. This trimester is when your unborn baby (fetus) is growing very fast. At the end of the ninth month, the unborn baby is about 20 inches long. It weighs about 6-10 pounds. Body changes during your third trimester Your body continues to go through many changes during this time. The changes vary and generally return to normal after the baby is born. Physical changes  Your weight will continue to increase. You may gain 25-35 pounds (11-16 kg) by the  end of the pregnancy. If you are underweight, you may gain 28-40 lb (about 13-18 kg). If you are overweight, you may gain 15-25 lb (about 7-11 kg).  You may start to get stretch marks on your hips, belly (abdomen), and breasts.  Your breasts will continue to grow and may hurt. A yellow fluid (colostrum) may leak from your breasts. This is the first milk you are making for your baby.  You may have changes in your hair.  Your belly button may stick out.  You may have more swelling in your hands, face, or ankles. Health changes  You may have heartburn.  You may have trouble pooping (constipation).  You may get hemorrhoids. These are swollen veins in the butt that can itch or get painful.  You may have swollen veins (varicose veins) in your legs.  You may have more body aches in the pelvis, back, or thighs.  You may have more tingling or numbness in your hands, arms, and legs. The skin on your belly may also feel numb.  You may feel short of breath as your womb (uterus) gets bigger. Other changes  You may pee (urinate) more often.  You may have more problems sleeping.  You may notice the unborn baby "dropping," or moving lower in your belly.  You may have more discharge coming from your vagina.  Your joints may feel loose, and you may have pain around your pelvic bone. Follow these instructions at home: Medicines  Take over-the-counter and prescription medicines only as told by your doctor. Some medicines are not safe during pregnancy.  Take a prenatal vitamin that contains at least 600 micrograms (mcg) of folic acid. Eating and drinking  Eat healthy meals that include: ? Fresh fruits and vegetables. ? Whole grains. ? Good sources of protein, such as meat, eggs, or tofu. ? Low-fat dairy products.  Avoid raw meat and unpasteurized juice, milk, and cheese. These carry germs that can harm you and your baby.  Eat 4 or 5 small meals rather than 3 large meals a day.  You  may need to take these actions to prevent or treat trouble pooping: ? Drink enough fluids to keep your pee (urine) pale yellow. ? Eat foods that are high in fiber. These include beans, whole grains, and fresh fruits and vegetables. ? Limit foods that are high in fat and sugar. These include fried or sweet foods. Activity  Exercise only as told by your doctor. Stop exercising if you start to have cramps in your womb.  Avoid heavy lifting.  Do not exercise if it is too hot or too humid, or if you are in a place of great height (high altitude).  If you choose to, you may have sex unless your doctor tells you not to. Relieving  pain and discomfort  Take breaks often, and rest with your legs raised (elevated) if you have leg cramps or low back pain.  Take warm water baths (sitz baths) to soothe pain or discomfort caused by hemorrhoids. Use hemorrhoid cream if your doctor approves.  Wear a good support bra if your breasts are tender.  If you develop bulging, swollen veins in your legs: ? Wear support hose as told by your doctor. ? Raise your feet for 15 minutes, 3-4 times a day. ? Limit salt in your food. Safety  Talk to your doctor before traveling far distances.  Do not use hot tubs, steam rooms, or saunas.  Wear your seat belt at all times when you are in a car.  Talk with your doctor if someone is hurting you or yelling at you a lot. Preparing for your baby's arrival To prepare for the arrival of your baby:  Take prenatal classes.  Visit the hospital and tour the maternity area.  Buy a rear-facing car seat. Learn how to install it in your car.  Prepare the baby's room. Take out all pillows and stuffed animals from the baby's crib. General instructions  Avoid cat litter boxes and soil used by cats. These carry germs that can cause harm to the baby and can cause a loss of your baby by miscarriage or stillbirth.  Do not douche or use tampons. Do not use scented sanitary  pads.  Do not smoke or use any products that contain nicotine or tobacco. If you need help quitting, ask your doctor.  Do not drink alcohol.  Do not use herbal medicines, illegal drugs, or medicines that were not approved by your doctor. Chemicals in these products can affect your baby.  Keep all follow-up visits. This is important. Where to find more information  American Pregnancy Association: americanpregnancy.org  [employer]: www.acog.org  Office on Women's Health: [employer] Contact a doctor if:  You have a fever.  You have mild cramps or pressure in your lower belly.  You have a nagging pain in your belly area.  You vomit, or you have watery poop (diarrhea).  You have bad-smelling fluid coming from your vagina.  You have pain when you pee, or your pee smells bad.  You have a headache that does not go away when you take medicine.  You have changes in how you see, or you see spots in front of your eyes. Get help right away if:  Your water breaks.  You have regular contractions that are less than 5 minutes apart.  You are spotting or bleeding from your vagina.  You have very bad belly cramps or pain.  You have trouble breathing.  You have chest pain.  You faint.  You have not felt the baby move for the amount of time told by your doctor.  You have new or increased pain, swelling, or redness in an arm or leg. Summary  The third trimester is from week 28 through week 40 (months 7 through 9). This is the time when your unborn baby is growing very fast.  During this time, your discomfort may increase as you gain weight and as your baby grows.  Get ready for your baby to arrive by taking prenatal classes, buying a rear-facing car seat, and preparing the baby's room.  Get help right away if you are bleeding from your vagina, you have chest pain and trouble breathing, or you have not felt the baby move  for  the amount of time told by your doctor. This information is not intended to replace advice given to you by your health care provider. Make sure you discuss any questions you have with your health care provider. Document Revised: 04/30/2020 Document Reviewed: 03/06/2020 Elsevier Patient Education  2021 [employer].

## 2021-04-06 NOTE — Progress Notes (Signed)
ROB: She has been having some back pain and leg cramps. No other concerns today.

## 2021-04-06 NOTE — Progress Notes (Signed)
ROB-Reports back pain due to work; pregnancy precaution letter given. Declines the need for PT at this time.  Discussed home treatment measures and belly support, patient verbalizes understanding. Anticipatory guidance regarding course of prenatal care. Reviewed red flag symptoms and when to call. RTC x 2 weeks for ROB with ANNIE; RTC x 4 weeks for ROB with JML or sooner if needed.  Juliann Pares, Student-[employer]ife [employer] 04/06/21 3:25 PM

## 2021-04-06 NOTE — Progress Notes (Signed)
I have seen, interviewed, and examined the patient in conjunction with the [employer] and affirm the diagnosis and management plan.   Gunnar Bulla, CNM [facility], [facility] 04/06/21 4:46 PM

## 2021-04-20 ENCOUNTER — Ambulatory Visit (INDEPENDENT_AMBULATORY_CARE_PROVIDER_SITE_OTHER): Payer: Medicaid Other | Admitting: Certified Nurse Midwife

## 2021-04-20 ENCOUNTER — Other Ambulatory Visit: Payer: Self-pay

## 2021-04-20 ENCOUNTER — Encounter: Payer: Self-pay | Admitting: Certified Nurse Midwife

## 2021-04-20 VITALS — BP 104/65 | HR 80 | Wt 147.2 lb

## 2021-04-20 DIAGNOSIS — Z3483 Encounter for supervision of other normal pregnancy, third trimester: Secondary | ICD-10-CM

## 2021-04-20 DIAGNOSIS — Z3A32 32 weeks gestation of pregnancy: Secondary | ICD-10-CM

## 2021-04-20 LAB — POCT URINALYSIS DIPSTICK OB
Bilirubin, UA: NEGATIVE
Blood, UA: NEGATIVE
Glucose, UA: NEGATIVE
Ketones, UA: NEGATIVE
Nitrite, UA: NEGATIVE
POC,PROTEIN,UA: NEGATIVE
Spec Grav, UA: 1.02 (ref 1.010–1.025)
Urobilinogen, UA: 0.2 E.U./dL
pH, UA: 6 (ref 5.0–8.0)

## 2021-04-20 NOTE — Progress Notes (Signed)
OB-Pt present for routine prenatal care. Pt c/o lower abd/vaginal pressure and pain.

## 2021-04-20 NOTE — Patient Instructions (Signed)
WHAT OB PATIENTS CAN EXPECT   Confirmation of pregnancy and ultrasound ordered if medically indicated-[redacted] weeks gestation  New OB (NOB) intake with nurse and New OB (NOB) labs- [redacted] weeks gestation  New OB (NOB) physical examination with provider- 11/[redacted] weeks gestation  Flu vaccine-[redacted] weeks gestation  Anatomy scan-[redacted] weeks gestation  Glucose tolerance test, blood work to test for anemia, T-dap vaccine-[redacted] weeks gestation  Vaginal swabs/cultures-STD/Group B strep-[redacted] weeks gestation  Appointments every 4 weeks until 28 weeks  Every 2 weeks from 28 weeks until 36 weeks  Weekly visits from 36 weeks until delivery  Common Medications Safe in Pregnancy  Acne:      Constipation:  Benzoyl Peroxide     Colace  Clindamycin      Dulcolax Suppository  Topica Erythromycin     Fibercon  Salicylic Acid      Metamucil         Miralax AVOID:        Senakot   Accutane    Cough:  Retin-A       Cough Drops  Tetracycline      Phenergan w/ Codeine if Rx  Minocycline      Robitussin (Plain & DM)  Antibiotics:     Crabs/Lice:  Ceclor       RID  Cephalosporins    AVOID:  E-Mycins      Kwell  Keflex  Macrobid/Macrodantin   Diarrhea:  Penicillin      Kao-Pectate  Zithromax      Imodium AD         PUSH FLUIDS AVOID:       Cipro     Fever:  Tetracycline      Tylenol (Regular or Extra  Minocycline       Strength)  Levaquin      Extra Strength-Do not          Exceed 8 tabs/24 hrs Caffeine:        <256m/day (equiv. To 1 cup of coffee or  approx. 3 12 oz sodas)         Gas: Cold/Hayfever:       Gas-X  Benadryl      Mylicon  Claritin       Phazyme  **Claritin-D        Chlor-Trimeton    Headaches:  Dimetapp      ASA-Free Excedrin  Drixoral-Non-Drowsy     Cold Compress  Mucinex (Guaifenasin)     Tylenol (Regular or Extra  Sudafed/Sudafed-12 Hour     Strength)  **Sudafed PE Pseudoephedrine   Tylenol Cold & Sinus     Vicks Vapor Rub  Zyrtec  **AVOID if Problems With Blood  Pressure         Heartburn: Avoid lying down for at least 1 hour after meals  Aciphex      Maalox     Rash:  Milk of Magnesia     Benadryl    Mylanta       1% Hydrocortisone Cream  Pepcid  Pepcid Complete   Sleep Aids:  Prevacid      Ambien   Prilosec       Benadryl  Rolaids       Chamomile Tea  Tums (Limit 4/day)     Unisom         Tylenol PM         Warm milk-add vanilla or  Hemorrhoids:       Sugar for taste  Anusol/Anusol H.C.  (RX: Analapram 2.5%)  Sugar Substitutes:  Hydrocortisone OTC     Ok in moderation  Preparation H      Tucks        Vaseline lotion applied to tissue with wiping    Herpes:     Throat:  Acyclovir      Oragel  Famvir  Valtrex     Vaccines:         Flu Shot Leg Cramps:       *Gardasil  Benadryl      Hepatitis A         Hepatitis B Nasal Spray:       Pneumovax  Saline Nasal Spray     Polio Booster         Tetanus Nausea:       Tuberculosis test or PPD  Vitamin B6 25 mg TID   AVOID:    Dramamine      *Gardasil  Emetrol       Live Poliovirus  Ginger Root 250 mg QID    MMR (measles, mumps &  High Complex Carbs @ Bedtime    rebella)  Sea Bands-Accupressure    Varicella (Chickenpox)  Unisom 1/2 tab TID     *No known complications           If received before Pain:         Known pregnancy;   Darvocet       Resume series after  Lortab        Delivery  Percocet    Yeast:   Tramadol      Femstat  Tylenol 3      Gyne-lotrimin  Ultram       Monistat  Vicodin           MISC:         All Sunscreens           Hair Coloring/highlights          Insect Repellant's          (Including DEET)         Mystic Tans Breastfeeding  Choosing to breastfeed is one of the best decisions you can make for yourself and your baby. A change in hormones during pregnancy causes your breasts to make breast milk in your milk-producing glands. Hormones prevent breast milk from being released before your baby is born. They also prompt milk flow after birth. Once  breastfeeding has begun, thoughts of your baby, as well as his or her sucking or crying, can stimulate the release of milk from your milk-producing glands. Benefits of breastfeeding Research shows that breastfeeding offers many health benefits for infants and mothers. It also offers a cost-free and convenient way to feed your baby. For your baby  Your first milk (colostrum) helps your baby's digestive system to function better.  Special cells in your milk (antibodies) help your baby to fight off infections.  Breastfed babies are less likely to develop asthma, allergies, obesity, or type 2 diabetes. They are also at lower risk for sudden infant death syndrome (SIDS).  Nutrients in breast milk are better able to meet your baby's needs compared to infant formula.  Breast milk improves your baby's brain development. For you  Breastfeeding helps to create a very special bond between you and your baby.  Breastfeeding is convenient. Breast milk costs nothing and is always available at the correct temperature.  Breastfeeding helps to burn calories. It helps you to lose the weight that you gained during pregnancy.  Breastfeeding  makes your uterus return faster to its size before pregnancy. It also slows bleeding (lochia) after you give birth.  Breastfeeding helps to lower your risk of developing type 2 diabetes, osteoporosis, rheumatoid arthritis, cardiovascular disease, and breast, ovarian, uterine, and endometrial cancer later in life. Breastfeeding basics Starting breastfeeding  Find a comfortable place to sit or lie down, with your neck and back well-supported.  Place a pillow or a rolled-up blanket under your baby to bring him or her to the level of your breast (if you are seated). Nursing pillows are specially designed to help support your arms and your baby while you breastfeed.  Make sure that your baby's tummy (abdomen) is facing your abdomen.  Gently massage your breast. With your  fingertips, massage from the outer edges of your breast inward toward the nipple. This encourages milk flow. If your milk flows slowly, you may need to continue this action during the feeding.  Support your breast with 4 fingers underneath and your thumb above your nipple (make the letter "C" with your hand). Make sure your fingers are well away from your nipple and your baby's mouth.  Stroke your baby's lips gently with your finger or nipple.  When your baby's mouth is open wide enough, quickly bring your baby to your breast, placing your entire nipple and as much of the areola as possible into your baby's mouth. The areola is the colored area around your nipple. ? More areola should be visible above your baby's upper lip than below the lower lip. ? Your baby's lips should be opened and extended outward (flanged) to ensure an adequate, comfortable latch. ? Your baby's tongue should be between his or her lower gum and your breast.  Make sure that your baby's mouth is correctly positioned around your nipple (latched). Your baby's lips should create a seal on your breast and be turned out (everted).  It is common for your baby to suck about 2-3 minutes in order to start the flow of breast milk. Latching Teaching your baby how to latch onto your breast properly is very important. An improper latch can cause nipple pain, decreased milk supply, and poor weight gain in your baby. Also, if your baby is not latched onto your nipple properly, he or she may swallow some air during feeding. This can make your baby fussy. Burping your baby when you switch breasts during the feeding can help to get rid of the air. However, teaching your baby to latch on properly is still the best way to prevent fussiness from swallowing air while breastfeeding. Signs that your baby has successfully latched onto your nipple  Silent tugging or silent sucking, without causing you pain. Infant's lips should be extended outward  (flanged).  Swallowing heard between every 3-4 sucks once your milk has started to flow (after your let-down milk reflex occurs).  Muscle movement above and in front of his or her ears while sucking. Signs that your baby has not successfully latched onto your nipple  Sucking sounds or smacking sounds from your baby while breastfeeding.  Nipple pain. If you think your baby has not latched on correctly, slip your finger into the corner of your baby's mouth to break the suction and place it between your baby's gums. Attempt to start breastfeeding again. Signs of successful breastfeeding Signs from your baby  Your baby will gradually decrease the number of sucks or will completely stop sucking.  Your baby will fall asleep.  Your baby's body will relax.  Your baby will retain a small amount of milk in his or her mouth.  Your baby will let go of your breast by himself or herself. Signs from you  Breasts that have increased in firmness, weight, and size 1-3 hours after feeding.  Breasts that are softer immediately after breastfeeding.  Increased milk volume, as well as a change in milk consistency and color by the fifth day of breastfeeding.  Nipples that are not sore, cracked, or bleeding. Signs that your baby is getting enough milk  Wetting at least 1-2 diapers during the first 24 hours after birth.  Wetting at least 5-6 diapers every 24 hours for the first week after birth. The urine should be clear or pale yellow by the age of 5 days.  Wetting 6-8 diapers every 24 hours as your baby continues to grow and develop.  At least 3 stools in a 24-hour period by the age of 5 days. The stool should be soft and yellow.  At least 3 stools in a 24-hour period by the age of 7 days. The stool should be seedy and yellow.  No loss of weight greater than 10% of birth weight during the first 3 days of life.  Average weight gain of 4-7 oz (113-198 g) per week after the age of 4  days.  Consistent daily weight gain by the age of 5 days, without weight loss after the age of 2 weeks. After a feeding, your baby may spit up a small amount of milk. This is normal. Breastfeeding frequency and duration Frequent feeding will help you make more milk and can prevent sore nipples and extremely full breasts (breast engorgement). Breastfeed when you feel the need to reduce the fullness of your breasts or when your baby shows signs of hunger. This is called "breastfeeding on demand." Signs that your baby is hungry include:  Increased alertness, activity, or restlessness.  Movement of the head from side to side.  Opening of the mouth when the corner of the mouth or cheek is stroked (rooting).  Increased sucking sounds, smacking lips, cooing, sighing, or squeaking.  Hand-to-mouth movements and sucking on fingers or hands.  Fussing or crying. Avoid introducing a pacifier to your baby in the first 4-6 weeks after your baby is born. After this time, you may choose to use a pacifier. Research has shown that pacifier use during the first year of a baby's life decreases the risk of sudden infant death syndrome (SIDS). Allow your baby to feed on each breast as long as he or she wants. When your baby unlatches or falls asleep while feeding from the first breast, offer the second breast. Because newborns are often sleepy in the first few weeks of life, you may need to awaken your baby to get him or her to feed. Breastfeeding times will vary from baby to baby. However, the following rules can serve as a guide to help you make sure that your baby is properly fed:  Newborns (babies 64 weeks of age or younger) may breastfeed every 1-3 hours.  Newborns should not go without breastfeeding for longer than 3 hours during the day or 5 hours during the night.  You should breastfeed your baby a minimum of 8 times in a 24-hour period. Breast milk pumping Pumping and storing breast milk allows you to  make sure that your baby is exclusively fed your breast milk, even at times when you are unable to breastfeed. This is especially important if you go back to  work while you are still breastfeeding, or if you are not able to be present during feedings. Your lactation consultant can help you find a method of pumping that works best for you and give you guidelines about how long it is safe to store breast milk.      Caring for your breasts while you breastfeed Nipples can become dry, cracked, and sore while breastfeeding. The following recommendations can help keep your breasts moisturized and healthy:  Avoid using soap on your nipples.  Wear a supportive bra designed especially for nursing. Avoid wearing underwire-style bras or extremely tight bras (sports bras).  Air-dry your nipples for 3-4 minutes after each feeding.  Use only cotton bra pads to absorb leaked breast milk. Leaking of breast milk between feedings is normal.  Use lanolin on your nipples after breastfeeding. Lanolin helps to maintain your skin's normal moisture barrier. Pure lanolin is not harmful (not toxic) to your baby. You may also hand express a few drops of breast milk and gently massage that milk into your nipples and allow the milk to air-dry. In the first few weeks after giving birth, some women experience breast engorgement. Engorgement can make your breasts feel heavy, warm, and tender to the touch. Engorgement peaks within 3-5 days after you give birth. The following recommendations can help to ease engorgement:  Completely empty your breasts while breastfeeding or pumping. You may want to start by applying warm, moist heat (in the shower or with warm, water-soaked hand towels) just before feeding or pumping. This increases circulation and helps the milk flow. If your baby does not completely empty your breasts while breastfeeding, pump any extra milk after he or she is finished.  Apply ice packs to your breasts  immediately after breastfeeding or pumping, unless this is too uncomfortable for you. To do this: ? Put ice in a plastic bag. ? Place a towel between your skin and the bag. ? Leave the ice on for 20 minutes, 2-3 times a day.  Make sure that your baby is latched on and positioned properly while breastfeeding. If engorgement persists after 48 hours of following these recommendations, contact your health care provider or a Science writer. Overall health care recommendations while breastfeeding  Eat 3 healthy meals and 3 snacks every day. Well-nourished mothers who are breastfeeding need an additional 450-500 calories a day. You can meet this requirement by increasing the amount of a balanced diet that you eat.  Drink enough water to keep your urine pale yellow or clear.  Rest often, relax, and continue to take your prenatal vitamins to prevent fatigue, stress, and low vitamin and mineral levels in your body (nutrient deficiencies).  Do not use any products that contain nicotine or tobacco, such as cigarettes and e-cigarettes. Your baby may be harmed by chemicals from cigarettes that pass into breast milk and exposure to secondhand smoke. If you need help quitting, ask your health care provider.  Avoid alcohol.  Do not use illegal drugs or marijuana.  Talk with your health care provider before taking any medicines. These include over-the-counter and prescription medicines as well as vitamins and herbal supplements. Some medicines that may be harmful to your baby can pass through breast milk.  It is possible to become pregnant while breastfeeding. If birth control is desired, ask your health care provider about options that will be safe while breastfeeding your baby. Where to find more information: [employer]: www.llli.org Contact a health care provider if:  You feel  like you want to stop breastfeeding or have become frustrated with breastfeeding.  Your nipples are  cracked or bleeding.  Your breasts are red, tender, or warm.  You have: ? Painful breasts or nipples. ? A swollen area on either breast. ? A fever or chills. ? Nausea or vomiting. ? Drainage other than breast milk from your nipples.  Your breasts do not become full before feedings by the fifth day after you give birth.  You feel sad and depressed.  Your baby is: ? Too sleepy to eat well. ? Having trouble sleeping. ? More than 79 week old and wetting fewer than 6 diapers in a 24-hour period. ? Not gaining weight by 57 days of age.  Your baby has fewer than 3 stools in a 24-hour period.  Your baby's skin or the white parts of his or her eyes become yellow. Get help right away if:  Your baby is overly tired (lethargic) and does not want to wake up and feed.  Your baby develops an unexplained fever. Summary  Breastfeeding offers many health benefits for infant and mothers.  Try to breastfeed your infant when he or she shows early signs of hunger.  Gently tickle or stroke your baby's lips with your finger or nipple to allow the baby to open his or her mouth. Bring the baby to your breast. Make sure that much of the areola is in your baby's mouth. Offer one side and burp the baby before you offer the other side.  Talk with your health care provider or lactation consultant if you have questions or you face problems as you breastfeed. This information is not intended to replace advice given to you by your health care provider. Make sure you discuss any questions you have with your health care provider. Document Revised: 02/16/2018 Document Reviewed: 12/24/2016 Elsevier Patient Education  2021 [employer].

## 2021-04-20 NOTE — Progress Notes (Signed)
ROB doing well , feels good fetal movement. Pt has not gotten belly band yet. Pt encouraged to get one to help with pressure and round ligament pain. She verbalizes and agrees to plan. She denies any concerns. Reviewed PTL precautions. Follow up 2 wk for ROB with Marcelino Duster.   Doreene Burke, CNM

## 2021-05-07 ENCOUNTER — Ambulatory Visit (INDEPENDENT_AMBULATORY_CARE_PROVIDER_SITE_OTHER): Payer: Medicaid Other | Admitting: Certified Nurse Midwife

## 2021-05-07 ENCOUNTER — Other Ambulatory Visit (HOSPITAL_COMMUNITY)
Admission: RE | Admit: 2021-05-07 | Discharge: 2021-05-07 | Disposition: A | Discharge status: Home / Self Care | Payer: Medicaid Other | Source: Ambulatory Visit | Attending: Certified Nurse Midwife | Admitting: Certified Nurse Midwife

## 2021-05-07 ENCOUNTER — Encounter: Payer: Self-pay | Admitting: Certified Nurse Midwife

## 2021-05-07 ENCOUNTER — Other Ambulatory Visit: Payer: Self-pay

## 2021-05-07 VITALS — BP 97/60 | HR 83 | Wt 154.6 lb

## 2021-05-07 DIAGNOSIS — Z3403 Encounter for supervision of normal first pregnancy, third trimester: Secondary | ICD-10-CM

## 2021-05-07 DIAGNOSIS — N898 Other specified noninflammatory disorders of vagina: Secondary | ICD-10-CM

## 2021-05-07 DIAGNOSIS — Z3483 Encounter for supervision of other normal pregnancy, third trimester: Secondary | ICD-10-CM

## 2021-05-07 DIAGNOSIS — O26893 Other specified pregnancy related conditions, third trimester: Secondary | ICD-10-CM | POA: Insufficient documentation

## 2021-05-07 DIAGNOSIS — Z3A34 34 weeks gestation of pregnancy: Secondary | ICD-10-CM

## 2021-05-07 NOTE — Patient Instructions (Signed)
Fetal Movement Counts Patient Name: ________________________________________________ Patient Due Date: ____________________  What is a fetal movement count? A fetal movement count is the number of times that you feel your baby move during a certain amount of time. This may also be called a fetal kick count. A fetal movement count is recommended for every pregnant woman. You may be asked to start counting fetal movements as early as week 28 of your pregnancy. Pay attention to when your baby is most active. You may notice your baby's sleep and wake cycles. You may also notice things that make your baby move more. You should do a fetal movement count:  When your baby is normally most active.  At the same time each day. A good time to count movements is while you are resting, after having something to eat and drink. How do I count fetal movements? 1. Find a quiet, comfortable area. Sit, or lie down on your side. 2. Write down the date, the start time and stop time, and the number of movements that you felt between those two times. Take this information with you to your health care visits. 3. Write down your start time when you feel the first movement. 4. Count kicks, flutters, swishes, rolls, and jabs. You should feel at least 10 movements. 5. You may stop counting after you have felt 10 movements, or if you have been counting for 2 hours. Write down the stop time. 6. If you do not feel 10 movements in 2 hours, contact your health care provider for further instructions. Your health care provider may want to do additional tests to assess your baby's well-being. Contact a health care provider if:  You feel fewer than 10 movements in 2 hours.  Your baby is not moving like he or she usually does. Date: ____________ Start time: ____________ Stop time: ____________ Movements: ____________ Date: ____________ Start time: ____________ Stop time: ____________ Movements: ____________ Date: ____________  Start time: ____________ Stop time: ____________ Movements: ____________ Date: ____________ Start time: ____________ Stop time: ____________ Movements: ____________ Date: ____________ Start time: ____________ Stop time: ____________ Movements: ____________ Date: ____________ Start time: ____________ Stop time: ____________ Movements: ____________ Date: ____________ Start time: ____________ Stop time: ____________ Movements: ____________ Date: ____________ Start time: ____________ Stop time: ____________ Movements: ____________ Date: ____________ Start time: ____________ Stop time: ____________ Movements: ____________ This information is not intended to replace advice given to you by your health care provider. Make sure you discuss any questions you have with your health care provider. Document Revised: 07/12/2019 Document Reviewed: 07/12/2019 Elsevier Patient Education  2021 [employer]  

## 2021-05-07 NOTE — Progress Notes (Signed)
ROB-Reports vaginal discharge and odor for the last two (2) weeks. Self swab collected, see orders. Anticipatory guidance regarding course of prenatal care. Reviewed red flag symptoms and when to call. RTC as previously scheduled or sooner if needed.

## 2021-05-07 NOTE — Progress Notes (Signed)
ROB: Doing well. Has some concerns about discharge with slight odor x 2 weeks. No urine at intake.

## 2021-05-08 LAB — CERVICOVAGINAL ANCILLARY ONLY
Bacterial Vaginitis (gardnerella): POSITIVE — AB
Comment: NEGATIVE

## 2021-05-13 ENCOUNTER — Other Ambulatory Visit: Payer: Self-pay | Admitting: Certified Nurse Midwife

## 2021-05-13 DIAGNOSIS — B9689 Other specified bacterial agents as the cause of diseases classified elsewhere: Secondary | ICD-10-CM

## 2021-05-13 MED ORDER — METRONIDAZOLE 500 MG PO TABS: 500.0000 mg | ORAL_TABLET | Freq: Two times a day (BID) | ORAL | 0 refills | Status: AC

## 2021-05-13 NOTE — Progress Notes (Signed)
Rx Flagyl, see orders.    Susan Santos, CNM [facility], [facility] 05/13/21 12:44 PM

## 2021-05-20 ENCOUNTER — Encounter: Payer: Self-pay | Admitting: Certified Nurse Midwife

## 2021-05-20 ENCOUNTER — Ambulatory Visit (INDEPENDENT_AMBULATORY_CARE_PROVIDER_SITE_OTHER): Payer: Medicaid Other | Admitting: Certified Nurse Midwife

## 2021-05-20 ENCOUNTER — Other Ambulatory Visit: Payer: Self-pay

## 2021-05-20 VITALS — BP 98/63 | HR 90 | Wt 158.3 lb

## 2021-05-20 DIAGNOSIS — Z3483 Encounter for supervision of other normal pregnancy, third trimester: Secondary | ICD-10-CM

## 2021-05-20 DIAGNOSIS — Z3A36 36 weeks gestation of pregnancy: Secondary | ICD-10-CM

## 2021-05-20 LAB — POCT URINALYSIS DIPSTICK
Bilirubin, UA: NEGATIVE
Blood, UA: NEGATIVE
Glucose, UA: NEGATIVE
Ketones, UA: NEGATIVE
Leukocytes, UA: NEGATIVE
Nitrite, UA: NEGATIVE
Protein, UA: NEGATIVE
Spec Grav, UA: 1.015 (ref 1.010–1.025)
Urobilinogen, UA: 0.2 E.U./dL
pH, UA: 6.5 (ref 5.0–8.0)

## 2021-05-20 NOTE — Progress Notes (Signed)
ROB doing well. Feels good movement. Herbal prep handout given. Labor precautions reviewed. GBS swab collected. All questions answered. Follow up 1 wk for ROB.   Doreene Burke, CNM

## 2021-05-20 NOTE — Patient Instructions (Signed)

## 2021-05-20 NOTE — Addendum Note (Signed)
Addended by: Silvano Bilis on: 05/20/2021 05:01 PM   Modules accepted: Orders

## 2021-05-23 LAB — STREP GP B NAA: Strep Gp B NAA: NEGATIVE

## 2021-05-25 LAB — GC/CHLAMYDIA PROBE AMP
Chlamydia trachomatis, NAA: NEGATIVE
Neisseria Gonorrhoeae by PCR: NEGATIVE

## 2021-05-28 ENCOUNTER — Encounter: Payer: Self-pay | Admitting: Certified Nurse Midwife

## 2021-05-28 ENCOUNTER — Other Ambulatory Visit: Payer: Self-pay

## 2021-05-28 ENCOUNTER — Ambulatory Visit (INDEPENDENT_AMBULATORY_CARE_PROVIDER_SITE_OTHER): Payer: Medicaid Other | Admitting: Certified Nurse Midwife

## 2021-05-28 VITALS — BP 109/69 | HR 108 | Wt 161.6 lb

## 2021-05-28 DIAGNOSIS — Z3483 Encounter for supervision of other normal pregnancy, third trimester: Secondary | ICD-10-CM

## 2021-05-28 DIAGNOSIS — Z3A37 37 weeks gestation of pregnancy: Secondary | ICD-10-CM

## 2021-05-28 LAB — POCT URINALYSIS DIPSTICK OB

## 2021-05-28 NOTE — Progress Notes (Signed)
ROB: She is doing well, no concerns today. 

## 2021-05-28 NOTE — Patient Instructions (Signed)
Fetal Movement Counts Patient Name: ________________________________________________ Patient DueDate: ____________________ What is a fetal movement count?  A fetal movement count is the number of times that you feel your baby move during a certain amount of time. This may also be called a fetal kick count. A fetal movement count is recommended for every pregnant woman. You may be askedto start counting fetal movements as early as week 28 of your pregnancy. Pay attention to when your baby is most active. You may notice your baby's sleep and wake cycles. You may also notice things that make your baby move more. You should do a fetal movement count: When your baby is normally most active. At the same time each day. A good time to count movements is while you are resting, after having somethingto eat and drink. How do I count fetal movements? Find a quiet, comfortable area. Sit, or lie down on your side. Write down the date, the start time and stop time, and the number of movements that you felt between those two times. Take this information with you to your health care visits. Write down your start time when you feel the first movement. Count kicks, flutters, swishes, rolls, and jabs. You should feel at least 10 movements. You may stop counting after you have felt 10 movements, or if you have been counting for 2 hours. Write down the stop time. If you do not feel 10 movements in 2 hours, contact your health care provider for further instructions. Your health care provider may want to do additional tests to assess your baby's well-being. Contact a health care provider if: You feel fewer than 10 movements in 2 hours. Your baby is not moving like he or she usually does. Date: ____________ Start time: ____________ Stop time: ____________ Movements:____________ Date: ____________ Start time: ____________ Stop time: ____________ Movements:____________ Date: ____________ Start time: ____________ Stop  time: ____________ Movements:____________ Date: ____________ Start time: ____________ Stop time: ____________ Movements:____________ Date: ____________ Start time: ____________ Stop time: ____________ Movements:____________ Date: ____________ Start time: ____________ Stop time: ____________ Movements:____________ Date: ____________ Start time: ____________ Stop time: ____________ Movements:____________ Date: ____________ Start time: ____________ Stop time: ____________ Movements:____________ Date: ____________ Start time: ____________ Stop time: ____________ Movements:____________ This information is not intended to replace advice given to you by your health care provider. Make sure you discuss any questions you have with your healthcare provider. Document Revised: 07/12/2019 Document Reviewed: 07/12/2019 Elsevier Patient Education  2022 Elsevier Inc.  

## 2021-05-28 NOTE — Progress Notes (Signed)
ROB-Reports increased pelvic pressure and back pain, requests SVE. Discussed home treatment measures. Anticipatory guidance regarding course of prenatal care. Reviewed red flag symptoms and when to call. RTC as previously scheduled or sooner if needed

## 2021-06-04 ENCOUNTER — Ambulatory Visit (INDEPENDENT_AMBULATORY_CARE_PROVIDER_SITE_OTHER): Payer: Medicaid Other | Admitting: Certified Nurse Midwife

## 2021-06-04 ENCOUNTER — Encounter: Payer: Self-pay | Admitting: Certified Nurse Midwife

## 2021-06-04 ENCOUNTER — Other Ambulatory Visit: Payer: Self-pay

## 2021-06-04 VITALS — BP 99/61 | HR 84 | Wt 162.1 lb

## 2021-06-04 DIAGNOSIS — Z3483 Encounter for supervision of other normal pregnancy, third trimester: Secondary | ICD-10-CM

## 2021-06-04 DIAGNOSIS — Z3A38 38 weeks gestation of pregnancy: Secondary | ICD-10-CM

## 2021-06-04 LAB — POCT URINALYSIS DIPSTICK OB
Glucose, UA: NEGATIVE
Spec Grav, UA: 1.01 (ref 1.010–1.025)
Urobilinogen, UA: 0.2 E.U./dL
pH, UA: 7 (ref 5.0–8.0)

## 2021-06-04 NOTE — Progress Notes (Signed)
ROB: She has been having pain and she thinks they are contractions that comes and goes. She is able to tolerate pain right now.

## 2021-06-04 NOTE — Patient Instructions (Signed)
Fetal Movement Counts Patient Name: ________________________________________________ Patient DueDate: ____________________ What is a fetal movement count?  A fetal movement count is the number of times that you feel your baby move during a certain amount of time. This may also be called a fetal kick count. A fetal movement count is recommended for every pregnant woman. You may be askedto start counting fetal movements as early as week 28 of your pregnancy. Pay attention to when your baby is most active. You may notice your baby's sleep and wake cycles. You may also notice things that make your baby move more. You should do a fetal movement count: When your baby is normally most active. At the same time each day. A good time to count movements is while you are resting, after having somethingto eat and drink. How do I count fetal movements? Find a quiet, comfortable area. Sit, or lie down on your side. Write down the date, the start time and stop time, and the number of movements that you felt between those two times. Take this information with you to your health care visits. Write down your start time when you feel the first movement. Count kicks, flutters, swishes, rolls, and jabs. You should feel at least 10 movements. You may stop counting after you have felt 10 movements, or if you have been counting for 2 hours. Write down the stop time. If you do not feel 10 movements in 2 hours, contact your health care provider for further instructions. Your health care provider may want to do additional tests to assess your baby's well-being. Contact a health care provider if: You feel fewer than 10 movements in 2 hours. Your baby is not moving like he or she usually does. Date: ____________ Start time: ____________ Stop time: ____________ Movements:____________ Date: ____________ Start time: ____________ Stop time: ____________ Movements:____________ Date: ____________ Start time: ____________ Stop  time: ____________ Movements:____________ Date: ____________ Start time: ____________ Stop time: ____________ Movements:____________ Date: ____________ Start time: ____________ Stop time: ____________ Movements:____________ Date: ____________ Start time: ____________ Stop time: ____________ Movements:____________ Date: ____________ Start time: ____________ Stop time: ____________ Movements:____________ Date: ____________ Start time: ____________ Stop time: ____________ Movements:____________ Date: ____________ Start time: ____________ Stop time: ____________ Movements:____________ This information is not intended to replace advice given to you by your health care provider. Make sure you discuss any questions you have with your healthcare provider. Document Revised: 07/12/2019 Document Reviewed: 07/12/2019 Elsevier Patient Education  2022 Elsevier Inc.  

## 2021-06-04 NOTE — Progress Notes (Signed)
ROB-Reports irregular contractions, requests SVE; unchanged since previous visit. Discussed home treatment measures and copy of [employer] given. Anticipatory guidance regarding course of prenatal care. Reviewed red flag symptoms and when to call. RTC x 1 week for ROB or sooner if needed.

## 2021-06-07 ENCOUNTER — Encounter: Payer: Self-pay | Admitting: Obstetrics and Gynecology

## 2021-06-07 ENCOUNTER — Other Ambulatory Visit: Payer: Self-pay

## 2021-06-07 ENCOUNTER — Inpatient Hospital Stay
Admission: EM | Admit: 2021-06-07 | Discharge: 2021-06-09 | DRG: 807 | Disposition: A | Discharge status: Home / Self Care | Payer: Medicaid Other | Attending: Certified Nurse Midwife | Admitting: Certified Nurse Midwife

## 2021-06-07 DIAGNOSIS — Z20822 Contact with and (suspected) exposure to covid-19: Secondary | ICD-10-CM | POA: Diagnosis present

## 2021-06-07 DIAGNOSIS — Z23 Encounter for immunization: Secondary | ICD-10-CM | POA: Diagnosis not present

## 2021-06-07 DIAGNOSIS — O26893 Other specified pregnancy related conditions, third trimester: Secondary | ICD-10-CM | POA: Diagnosis present

## 2021-06-07 DIAGNOSIS — O99893 Other specified diseases and conditions complicating puerperium: Secondary | ICD-10-CM | POA: Diagnosis not present

## 2021-06-07 DIAGNOSIS — Z3A39 39 weeks gestation of pregnancy: Secondary | ICD-10-CM | POA: Diagnosis not present

## 2021-06-07 DIAGNOSIS — I959 Hypotension, unspecified: Secondary | ICD-10-CM | POA: Diagnosis not present

## 2021-06-07 LAB — CBC
HCT: 35.5 % — ABNORMAL LOW (ref 36.0–46.0)
Hemoglobin: 12.3 g/dL (ref 12.0–15.0)
MCH: 31.9 pg (ref 26.0–34.0)
MCHC: 34.6 g/dL (ref 30.0–36.0)
MCV: 92 fL (ref 80.0–100.0)
Platelets: 157 10*3/uL (ref 150–400)
RBC: 3.86 MIL/uL — ABNORMAL LOW (ref 3.87–5.11)
RDW: 13.1 % (ref 11.5–15.5)
WBC: 13.2 10*3/uL — ABNORMAL HIGH (ref 4.0–10.5)
nRBC: 0 % (ref 0.0–0.2)

## 2021-06-07 LAB — TYPE AND SCREEN
ABO/RH(D): O POS
Antibody Screen: NEGATIVE

## 2021-06-07 LAB — RESP PANEL BY RT-PCR (FLU A&B, COVID) ARPGX2
Influenza A by PCR: NEGATIVE
Influenza B by PCR: NEGATIVE
SARS Coronavirus 2 by RT PCR: NEGATIVE

## 2021-06-07 MED ORDER — LACTATED RINGERS IV SOLN: 500.0000 mL | INTRAVENOUS | Status: DC | PRN

## 2021-06-07 MED ORDER — BUTORPHANOL TARTRATE 1 MG/ML IJ SOLN: 1.0000 mg | INTRAMUSCULAR | Status: DC | PRN

## 2021-06-07 MED ORDER — IBUPROFEN 600 MG PO TABS
ORAL_TABLET | ORAL | Status: AC
  Filled 2021-06-07: qty 1

## 2021-06-07 MED ORDER — LACTATED RINGERS IV SOLN: INTRAVENOUS | Status: DC

## 2021-06-07 MED ORDER — OXYTOCIN-SODIUM CHLORIDE 30-0.9 UT/500ML-% IV SOLN
INTRAVENOUS | Status: AC
  Filled 2021-06-07: qty 500

## 2021-06-07 MED ORDER — OXYCODONE-ACETAMINOPHEN 5-325 MG PO TABS
1.0000 | ORAL_TABLET | ORAL | Status: DC | PRN
  Administered 2021-06-07: 1 via ORAL
  Filled 2021-06-07: qty 1

## 2021-06-07 MED ORDER — LIDOCAINE HCL (PF) 1 % IJ SOLN
30.0000 mL | INTRAMUSCULAR | Status: AC | PRN
  Administered 2021-06-07: 30 mL via SUBCUTANEOUS

## 2021-06-07 MED ORDER — SOD CITRATE-CITRIC ACID 500-334 MG/5ML PO SOLN: 30.0000 mL | ORAL | Status: DC | PRN

## 2021-06-07 MED ORDER — IBUPROFEN 600 MG PO TABS
600.0000 mg | ORAL_TABLET | Freq: Four times a day (QID) | ORAL | Status: DC
  Administered 2021-06-07 – 2021-06-09 (×7): 600 mg via ORAL
  Filled 2021-06-07 (×6): qty 1

## 2021-06-07 MED ORDER — OXYTOCIN-SODIUM CHLORIDE 30-0.9 UT/500ML-% IV SOLN
2.5000 [IU]/h | INTRAVENOUS | Status: DC
  Administered 2021-06-07: 2.5 [IU]/h via INTRAVENOUS
  Filled 2021-06-07: qty 500

## 2021-06-07 MED ORDER — MISOPROSTOL 200 MCG PO TABS
ORAL_TABLET | ORAL | Status: AC
  Filled 2021-06-07: qty 4

## 2021-06-07 MED ORDER — OXYTOCIN 10 UNIT/ML IJ SOLN
INTRAMUSCULAR | Status: AC
  Filled 2021-06-07: qty 2

## 2021-06-07 MED ORDER — OXYCODONE-ACETAMINOPHEN 5-325 MG PO TABS: 2.0000 | ORAL_TABLET | ORAL | Status: DC | PRN

## 2021-06-07 MED ORDER — SODIUM CHLORIDE 0.9% FLUSH: 3.0000 mL | Freq: Two times a day (BID) | INTRAVENOUS | Status: DC

## 2021-06-07 MED ORDER — OXYTOCIN BOLUS FROM INFUSION
333.0000 mL | Freq: Once | INTRAVENOUS | Status: AC
  Administered 2021-06-07: 333 mL via INTRAVENOUS

## 2021-06-07 MED ORDER — LIDOCAINE HCL (PF) 1 % IJ SOLN
INTRAMUSCULAR | Status: AC
  Filled 2021-06-07: qty 30

## 2021-06-07 MED ORDER — AMMONIA AROMATIC IN INHA
RESPIRATORY_TRACT | Status: AC
  Filled 2021-06-07: qty 10

## 2021-06-07 MED ORDER — ACETAMINOPHEN 325 MG PO TABS
650.0000 mg | ORAL_TABLET | ORAL | Status: DC | PRN
  Administered 2021-06-08 – 2021-06-09 (×4): 650 mg via ORAL
  Filled 2021-06-07 (×4): qty 2

## 2021-06-07 MED ORDER — SODIUM CHLORIDE 0.9% FLUSH: 3.0000 mL | INTRAVENOUS | Status: DC | PRN

## 2021-06-07 MED ORDER — SODIUM CHLORIDE 0.9 % IV SOLN: 250.0000 mL | INTRAVENOUS | Status: DC | PRN

## 2021-06-07 MED ORDER — ACETAMINOPHEN 325 MG PO TABS: 650.0000 mg | ORAL_TABLET | ORAL | Status: DC | PRN

## 2021-06-07 MED ORDER — ONDANSETRON HCL 4 MG/2ML IJ SOLN: 4.0000 mg | Freq: Four times a day (QID) | INTRAMUSCULAR | Status: DC | PRN

## 2021-06-07 NOTE — H&P (Signed)
Obstetric History and Physical  Susan Santos is a 20 y.o. G3P1011 with IUP at [redacted]w[redacted]d presenting with regular contractions and spotting.    Patient states she has been having  regular, every three (3) to four (4) minutes contractions, minimal vaginal bleeding, intact membranes, with active fetal movement.    Denies difficulty breathing or respiratory distress, chest pain, dysuria, and leg pain or swelling.   Prenatal Course  Source of Care: [facility]-initial visit at 10 weeks, total: 13  Pregnancy complications or risks: History of postpartum anemia  Prenatal labs and studies:  ABO, Rh: O positive (12/15 1000)  Antibody: Negative (12/15 1000)  Rubella: 1.09 (12/15 1000)  Varicella: Equivocal (12/15 1000)  RPR: Non Reactive (04/18 1003)   HBsAg: Negative (12/15 1000)   HIV: Non Reactive (12/15 1000)   UUV:OZDGUYQI/-- (06/16 0807)  1 hr Glucola: 104 (04/18 1003)  Genetic screening: Low risk female (12/27 1334)  Anatomy US: Complete, normal (03/07 1553)  Past Medical History:  Diagnosis Date   Gallstones    Postpartum anemia 07/26/2017   Vaginal delivery 2018   girl    Past Surgical History:  Procedure Laterality Date   CHOLECYSTECTOMY N/A 06/11/2019   Procedure: LAPAROSCOPIC CHOLECYSTECTOMY;  Surgeon: Henrene Dodge, MD;  Location: [facility] ORS;  Service: General;  Laterality: N/A;    OB History  Gravida Para Term Preterm AB Living  3 1 1  0 1 1  SAB IAB Ectopic Multiple Live Births  0 0 0 0 1    # Outcome Date GA Lbr Len/2nd Weight Sex Delivery Anes PTL Lv  3 Current           2 AB 2020          1 Term 07/25/17 [redacted]w[redacted]d 14:48 / 04:40 3810 g F Vag-Vacuum None  LIV    Social History   Socioeconomic History   Marital status: Single    Spouse name: Not on file   Number of children: 1   Years of education: Not on file   Highest education level: Not on file  Occupational History   Not on file  Tobacco Use   Smoking status: Never   Smokeless tobacco: Never  Vaping Use    Vaping Use: Never used  Substance and Sexual Activity   Alcohol use: Not Currently    Comment: occasional   Drug use: No   Sexual activity: Yes    Partners: Male    Birth control/protection: None  Other Topics Concern   Not on file  Social History Narrative   Lives with mom and sister and 38 year old daughter   Social Determinants of Health   Financial Resource Strain: Not on file  Food Insecurity: Not on file  Transportation Needs: Not on file  Physical Activity: Not on file  Stress: Not on file  Social Connections: Not on file    Family History  Problem Relation Age of Onset   Diabetes Mother    Diabetes Maternal Uncle    Diabetes Maternal Grandmother    Breast cancer Neg Hx    Hypertension Neg Hx     Medications Prior to Admission  Medication Sig Dispense Refill Last Dose   Prenatal Vit-Fe Fumarate-FA (MULTIVITAMIN-PRENATAL) 27-0.8 MG TABS tablet Take 1 tablet by mouth daily at 12 noon. 30 tablet 12     No Known Allergies  Review of Systems: Negative except for what is mentioned in HPI.  Physical Exam:  Temp:  [98.2 F (36.8 C)] 98.2 F (36.8 C) (  07/03 1950) BP: (118)/(66) 118/66 (07/03 1950) Weight:  [73.5 kg] 73.5 kg (07/03 1952)  GENERAL: Well-developed, well-nourished female in no acute distress.   LUNGS: Clear to auscultation bilaterally.   HEART: Regular rate and rhythm.  ABDOMEN: Soft, nontender, nondistended, gravid.  EXTREMITIES: Nontender, no edema, 2+ distal pulses.  Cervical Exam: Dilation: 6 Effacement (%): 100 Station: -1 Presentation: Vertex Exam by:: JDaley  FHR Category I  Contractions: Every two (2) to four (4) minutes, soft resting tone   Pertinent Labs/Studies:   Results for orders placed or performed during the hospital encounter of 06/07/21 (from the past 24 hour(s))  CBC     Status: Abnormal   Collection Time: 06/07/21  8:03 PM  Result Value Ref Range   WBC 13.2 (H) 4.0 - 10.5 K/uL   RBC 3.86 (L) 3.87 - 5.11 MIL/uL    Hemoglobin 12.3 12.0 - 15.0 g/dL   HCT 11.5 (L) 72.6 - 20.3 %   MCV 92.0 80.0 - 100.0 fL   MCH 31.9 26.0 - 34.0 pg   MCHC 34.6 30.0 - 36.0 g/dL   RDW 55.9 74.1 - 63.8 %   Platelets 157 150 - 400 K/uL   nRBC 0.0 0.0 - 0.2 %    Assessment :  Susan Santos is a 20 y.o. G3P1011 at [redacted]w[redacted]d being admitted for labor, Rh positive, GBS negative  FHR Category I  Plan:  Admit to birthing suites, see orders.   Labor: Expectant management.    Plan: Hopeful for vaginal birth.   Dr. Logan Bores notified of admission and plan of care.    Gunnar Bulla, CNM [facility], [facility] 06/07/21 8:55 PM

## 2021-06-08 ENCOUNTER — Encounter: Payer: Self-pay | Admitting: Certified Nurse Midwife

## 2021-06-08 LAB — CBC
HCT: 29.4 % — ABNORMAL LOW (ref 36.0–46.0)
Hemoglobin: 10.4 g/dL — ABNORMAL LOW (ref 12.0–15.0)
MCH: 32.1 pg (ref 26.0–34.0)
MCHC: 35.4 g/dL (ref 30.0–36.0)
MCV: 90.7 fL (ref 80.0–100.0)
Platelets: 159 10*3/uL (ref 150–400)
RBC: 3.24 MIL/uL — ABNORMAL LOW (ref 3.87–5.11)
RDW: 12.8 % (ref 11.5–15.5)
WBC: 22.1 10*3/uL — ABNORMAL HIGH (ref 4.0–10.5)
nRBC: 0 % (ref 0.0–0.2)

## 2021-06-08 LAB — RPR: RPR Ser Ql: NONREACTIVE

## 2021-06-08 MED ORDER — SODIUM CHLORIDE 0.9% FLUSH: 3.0000 mL | INTRAVENOUS | Status: DC | PRN

## 2021-06-08 MED ORDER — ONDANSETRON HCL 4 MG/2ML IJ SOLN: 4.0000 mg | INTRAMUSCULAR | Status: DC | PRN

## 2021-06-08 MED ORDER — SODIUM CHLORIDE 0.9% FLUSH
3.0000 mL | Freq: Two times a day (BID) | INTRAVENOUS | Status: DC
  Administered 2021-06-08: 3 mL via INTRAVENOUS

## 2021-06-08 MED ORDER — DOCUSATE SODIUM 100 MG PO CAPS
100.0000 mg | ORAL_CAPSULE | Freq: Two times a day (BID) | ORAL | Status: DC
  Administered 2021-06-09: 100 mg via ORAL
  Filled 2021-06-08: qty 1

## 2021-06-08 MED ORDER — WITCH HAZEL-GLYCERIN EX PADS
1.0000 "application " | MEDICATED_PAD | CUTANEOUS | Status: DC | PRN
  Filled 2021-06-08: qty 100

## 2021-06-08 MED ORDER — DIPHENHYDRAMINE HCL 25 MG PO CAPS: 25.0000 mg | ORAL_CAPSULE | Freq: Four times a day (QID) | ORAL | Status: DC | PRN

## 2021-06-08 MED ORDER — OXYCODONE-ACETAMINOPHEN 5-325 MG PO TABS: 2.0000 | ORAL_TABLET | ORAL | Status: DC | PRN

## 2021-06-08 MED ORDER — ONDANSETRON HCL 4 MG PO TABS: 4.0000 mg | ORAL_TABLET | ORAL | Status: DC | PRN

## 2021-06-08 MED ORDER — OXYCODONE-ACETAMINOPHEN 5-325 MG PO TABS
1.0000 | ORAL_TABLET | ORAL | Status: DC | PRN
  Administered 2021-06-08 – 2021-06-09 (×4): 1 via ORAL
  Filled 2021-06-08 (×4): qty 1

## 2021-06-08 MED ORDER — COCONUT OIL OIL
1.0000 "application " | TOPICAL_OIL | Status: DC | PRN
  Filled 2021-06-08: qty 120

## 2021-06-08 MED ORDER — SIMETHICONE 80 MG PO CHEW: 80.0000 mg | CHEWABLE_TABLET | ORAL | Status: DC | PRN

## 2021-06-08 MED ORDER — BENZOCAINE-MENTHOL 20-0.5 % EX AERO
1.0000 "application " | INHALATION_SPRAY | CUTANEOUS | Status: DC | PRN
  Filled 2021-06-08: qty 56

## 2021-06-08 MED ORDER — SENNOSIDES-DOCUSATE SODIUM 8.6-50 MG PO TABS
2.0000 | ORAL_TABLET | ORAL | Status: DC
  Administered 2021-06-08 – 2021-06-09 (×2): 2 via ORAL
  Filled 2021-06-08 (×2): qty 2

## 2021-06-08 MED ORDER — SODIUM CHLORIDE 0.9 % IV SOLN: 250.0000 mL | INTRAVENOUS | Status: DC | PRN

## 2021-06-08 MED ORDER — MEDROXYPROGESTERONE ACETATE 150 MG/ML IM SUSP
150.0000 mg | INTRAMUSCULAR | Status: AC | PRN
  Administered 2021-06-09: 150 mg via INTRAMUSCULAR
  Filled 2021-06-08 (×2): qty 1

## 2021-06-08 MED ORDER — LACTATED RINGERS IV BOLUS
1000.0000 mL | Freq: Once | INTRAVENOUS | Status: AC
  Administered 2021-06-08: 1000 mL via INTRAVENOUS

## 2021-06-08 MED ORDER — PRENATAL MULTIVITAMIN CH
1.0000 | ORAL_TABLET | Freq: Every day | ORAL | Status: DC
  Administered 2021-06-08 – 2021-06-09 (×2): 1 via ORAL
  Filled 2021-06-08 (×2): qty 1

## 2021-06-08 MED ORDER — DIBUCAINE (PERIANAL) 1 % EX OINT: 1.0000 "application " | TOPICAL_OINTMENT | CUTANEOUS | Status: DC | PRN

## 2021-06-08 NOTE — Progress Notes (Signed)
Progress Note - Vaginal Delivery  Susan Santos is a 20 y.o. [id number] now PP day 1 s/p Vaginal, Spontaneous .   Subjective:  The patient reports no complaints, up ad lib, voiding, tolerating PO, and and she does feel a little light headed when she gets up.    Objective:  Vital signs in last 24 hours: Temp:  [97.4 F (36.3 C)-98.8 F (37.1 C)] 98.7 F (37.1 C) (07/04 0814) Pulse Rate:  [50-112] 60 (07/04 0814) Resp:  [18] 18 (07/04 0814) BP: (46-118)/(28-71) 108/56 (07/04 0814) SpO2:  [92 %-100 %] 100 % (07/04 0814) Weight:  [73.5 kg] 73.5 kg (07/03 1952)  Physical Exam:  General: alert, cooperative, appears stated age, fatigued, and no distress Lochia: appropriate Uterine Fundus: firm @ u -1     Data Review Recent Labs    06/07/21 2003 06/08/21 0356  HGB 12.3 10.4*  HCT 35.5* 29.4*    Assessment/Plan: Active Problems:   Normal labor   Spontaneous vaginal delivery   [redacted] weeks gestation of pregnancy   Plan for discharge tomorrow Discussed blood loss and treatment options. Pt states that she would prefer not to have a blood transfusion. Given her preference a repeat cbc was not completed. Discussed repeating if symptoms worsen or change. She agrees to plan.   -- Continue routine PP care.    Doreene Burke, CNM 06/08/2021 9:32 AM

## 2021-06-08 NOTE — Progress Notes (Signed)
Patient ID: Susan Santos, female   DOB: February 19, 2001, 20 y.o.   MRN: [id number]  Telephone call received from RN at 0205, informing provider of single episode of hypotension and patient "feeling faint", 160 ml clot evacuated from vagina by Labor and Delivery staff. RRT called by patient care RN at [phone].  0206 Dr. Logan Bores notified via telephone call of RRT.   0208 Telephone call to Clinical Coordinator, reports patient is stable at this time.   0225 Upon arrival to unit patient sitting in head eating sandwich tray while infant resting quietly at bedside in bassinet. Patient notes last oral intake at 1200 06/07/2021.   IVF bolus infusing. Patient states, she "feels better".   RN notes hemorrhage medications not given, because "she wasn't concerned about her bleeding".   Uterus firm. Rubra small. Vital signs wnl.   May saline lock IV after completion of bolus. Reviewed red flag symptoms and when to call.   Continue orders as written.    Serafina Royals, CNM [facility], [facility] 06/08/21 2:43 AM

## 2021-06-08 NOTE — Plan of Care (Signed)
  Problem: Education: Goal: Knowledge of condition will improve Outcome: Progressing   Problem: Education: Goal: Individualized Newborn Educational Video(s) Outcome: Progressing   

## 2021-06-08 NOTE — Progress Notes (Addendum)
Rapid response called per mother baby nurse request

## 2021-06-08 NOTE — Progress Notes (Addendum)
Pt assisted to toilet by NT. NT notified RN that patient is shivering and shaking while sitting on toilet.  RN entered room and pt visibly shaking and pale.  Pt stated she's cold and assisted back to bed.  VSS and patient given warm blanket.  Baby placed on chest to remain skin to skin after bath.  Pain medication administered.

## 2021-06-08 NOTE — Significant Event (Signed)
0130 pt called out stating feeling dizzy, entered room with BP machine and assessed pt. Pt face pale and and stated she feels faint. BP at 0133 46/28. Rapid Response called at 0134, fluids and iv pole obtained. Once iv connected LR started and IV appeared infiltrated. This nurse attempting stick on R hand and [place], RN attempting stick on left hand. Victorino Dike, RN doing fundal rub at same time and multiple clots expelled. Pads weighed by Victorino Dike, RN. Darl Pikes 's attempt was successful and fluids restarted. See flow sheet for run of BP's during this time. Rapid response team present at this time also. See flow sheet for assessments and times. Last BP 86/50 at 0200 and fundal rub at 0205 see flow sheet. Pt stable and requesting something to eat. Marcelino Duster notified of series of events at 0206.

## 2021-06-09 MED ORDER — IBUPROFEN 600 MG PO TABS: 600.0000 mg | ORAL_TABLET | Freq: Four times a day (QID) | ORAL | 0 refills | Status: DC

## 2021-06-09 MED ORDER — FERROUS SULFATE 325 (65 FE) MG PO TABS: 325.0000 mg | ORAL_TABLET | Freq: Every day | ORAL | 3 refills | Status: DC

## 2021-06-09 MED ORDER — DOCUSATE SODIUM 100 MG PO CAPS: 100.0000 mg | ORAL_CAPSULE | Freq: Two times a day (BID) | ORAL | 0 refills | Status: DC

## 2021-06-09 MED ORDER — VARICELLA VIRUS VACCINE LIVE 1350 PFU/0.5ML IJ SUSR
0.5000 mL | INTRAMUSCULAR | Status: AC | PRN
  Administered 2021-06-09: 0.5 mL via SUBCUTANEOUS
  Filled 2021-06-09 (×2): qty 0.5

## 2021-06-09 MED ORDER — MEDROXYPROGESTERONE ACETATE 150 MG/ML IM SUSP: 150.0000 mg | INTRAMUSCULAR | 3 refills | Status: DC

## 2021-06-09 NOTE — Lactation Note (Signed)
This note was copied from a baby's chart. Lactation Consultation Note  Patient Name: Susan Santos [id number] Date: 06/09/2021 Reason for consult: Initial assessment;Term Age:22 hours  Initial lactation visit. Mom is G3P2 SVD 35 hours ago. Mom reports limited BF experience with first child (now 3.5yrs old), and "gave up".   Mom has been primarily Bf'ing on the R breast, due to difficult latch on the L breast. Pump set-up to assist with stimulation/colostrum removal on L breast. LC notes that nipple does evert with stimulation but is shorter, baby has been given a pacifier which is leading to difficulty latching.   Size 86mm NS given, baby accepted well after 2 attempts- at breast for 10 minutes with strong rhythmic suck pattern. LC encouraged breast massage/compression, and stimulation of baby throughout feeding. Mom happy with feeding progress on this side as her R nipple has gotten tender/sore and has a crack- encouraged feedings on L breast and pumping on right for today with use of coconut oil.  LC provided education on newborn stomach size, feeding patterns, early cues, minimizing use of pacifier- how it can mask early cues, growth spurts and cluster feeding, milk supply and demand and normal course of lactation.   Mom is enrolled in [employer], and has already contacted them re: pump obtainment- mom plans to pump post feedings to build supply.   Whiteboard updated with [facility] name and number. Encouraged to call today for questions, and support.    Maternal Data Has patient been taught Hand Expression?: Yes Does the patient have breastfeeding experience prior to this delivery?: Yes How long did the patient breastfeed?: minimal  Feeding Mother's Current Feeding Choice: Breast Milk  LATCH Score Latch: Repeated attempts needed to sustain latch, nipple held in mouth throughout feeding, stimulation needed to elicit sucking reflex. (better w/ nipple shield)  Audible Swallowing: Spontaneous and  intermittent  Type of Nipple: Everted at rest and after stimulation (everts but short- nipple shield given)  Comfort (Breast/Nipple): Soft / non-tender  Hold (Positioning): Assistance needed to correctly position infant at breast and maintain latch.  LATCH Score: 8   Lactation Tools Discussed/Used Tools: Nipple Shields Nipple shield size: 20 Breast pump type: Double-Electric Breast Pump  Interventions Interventions: Breast feeding basics reviewed;Assisted with latch;Hand express;Breast massage;Breast compression;Adjust position;Support pillows;Position options;Education (nipple shield)  Discharge Discharge Education: Engorgement and breast care;Warning signs for feeding baby;Outpatient recommendation Pump: [facility] Loan[facility] (will attempt to get loaner) WIC Program: Yes  Consult Status Consult Status: Complete    Danford Bad 06/09/2021, 9:39 AM

## 2021-06-09 NOTE — Discharge Summary (Signed)
Patient Name: Susan Santos DOB: 16-Mar-2001 MRN: [id number]                            Discharge Summary  Date of Admission: 06/07/2021 Date of Discharge: 06/09/2021 Delivering Provider: Holly Bodily MICHELLE   Admitting Diagnosis: Normal labor [O80, Z37.9] at [redacted]w[redacted]d Secondary diagnosis:  Active Problems:   Normal labor   Spontaneous vaginal delivery   [redacted] weeks gestation of pregnancy   Mode of Delivery: normal spontaneous vaginal delivery              Discharge diagnosis: Term Pregnancy Delivered      Intrapartum Procedures:  none   Post partum procedures:  none  Complications:  1st degree perineal laceration                     Discharge Day SOAP Note:  Progress Note - Vaginal Delivery  Susan Santos is a 20 y.o. [id number] now PP day 2 s/p Vaginal, Spontaneous . Single episode of hypotension and patient "feeling faint", 160 ml clot evacuated from vagina  Subjective  The patient has the following complaints: has no unusual complaints  Pain is controlled with current medications.   Patient is urinating without difficulty.  She is ambulating well, no longer feeling light headed.    Objective  Vital signs: BP (!) 97/55 (BP Location: Right Arm)   Pulse 71   Temp 97.9 F (36.6 C) (Oral)   Resp 18   Ht 5\' 3"  (1.6 m)   Wt 73.5 kg   LMP 09/23/2020 (Approximate)   SpO2 98%   Breastfeeding Unknown   BMI 28.70 kg/m   Physical Exam: Gen: NAD  Fundus Fundal Tone: Firm @ u-2  Lochia Amount: Small         Data Review Labs: Lab Results  Component Value Date   WBC 22.1 (H) 06/08/2021   HGB 10.4 (L) 06/08/2021   HCT 29.4 (L) 06/08/2021   MCV 90.7 06/08/2021   PLT 159 06/08/2021   CBC Latest Ref Rng & Units 06/08/2021 06/07/2021 03/23/2021  WBC 4.0 - 10.5 K/uL 22.1(H) 13.2(H) 9.2  Hemoglobin 12.0 - 15.0 g/dL 10.4(L) 12.3 11.5  Hematocrit 36.0 - 46.0 % 29.4(L) 35.5(L) 33.9(L)  Platelets 150 - 400 K/uL 159 157 197   O POS  Edinburgh Score: Edinburgh  Postnatal Depression Scale Screening Tool 06/08/2021  I have been able to laugh and see the funny side of things. (No Data)  I have looked forward with enjoyment to things. -  I have blamed myself unnecessarily when things went wrong. -  I have been anxious or worried for no good reason. -  I have felt scared or panicky for no good reason. -  Things have been getting on top of me. -  I have been so unhappy that I have had difficulty sleeping. -  I have felt sad or miserable. -  I have been so unhappy that I have been crying. -  The thought of harming myself has occurred to me. 08/09/2021 Postnatal Depression Scale Total -    Assessment/Plan  Active Problems:   Normal labor   Spontaneous vaginal delivery   [redacted] weeks gestation of pregnancy    Plan for discharge today.  Discharge Instructions: Per After Visit Summary. Activity: Advance as tolerated. Pelvic rest for 6 weeks.  Also refer to After Visit Summary Diet: Regular Medications: Allergies as of  06/09/2021   No Known Allergies      Medication List     TAKE these medications    docusate sodium 100 MG capsule Commonly known as: COLACE Take 1 capsule (100 mg total) by mouth 2 (two) times daily.   ferrous sulfate 325 (65 FE) MG tablet Take 1 tablet (325 mg total) by mouth daily.   ibuprofen 600 MG tablet Commonly known as: ADVIL Take 1 tablet (600 mg total) by mouth every 6 (six) hours.   medroxyPROGESTERone 150 MG/ML injection Commonly known as: DEPO-PROVERA Inject 1 mL (150 mg total) into the muscle every 3 (three) months.   multivitamin-prenatal 27-0.8 MG Tabs tablet Take 1 tablet by mouth daily at 12 noon.       Outpatient follow up: 2 week tele visit , 6 wk postpartum visit with Pattricia Boss  Postpartum contraception: Depo injection, first dose give prior to discharge  Discharged Condition: good  Discharged to: home  Newborn Data: Disposition:home with mother  Apgars: APGAR (1 MIN): 9   APGAR (5 MINS): 9    APGAR (10 MINS):    Baby Feeding: Breast    Doreene Burke, CNM 06/09/2021 7:35 AM

## 2021-06-09 NOTE — Discharge Instructions (Signed)

## 2021-06-09 NOTE — Final Progress Note (Signed)
Discharge Day SOAP Note:  Progress Note - Vaginal Delivery  Susan Santos is a 20 y.o. [id number] now PP day 2 s/p Vaginal, Spontaneous . Single episode of hypotension and patient "feeling faint", 160 ml clot evacuated from vagina  Subjective  The patient has the following complaints: has no unusual complaints  Pain is controlled with current medications.   Patient is urinating without difficulty.  She is ambulating well, no longer feeling light headed.    Objective  Vital signs: BP (!) 97/55 (BP Location: Right Arm)   Pulse 71   Temp 97.9 F (36.6 C) (Oral)   Resp 18   Ht 5\' 3"  (1.6 m)   Wt 73.5 kg   LMP 09/23/2020 (Approximate)   SpO2 98%   Breastfeeding Unknown   BMI 28.70 kg/m   Physical Exam: Gen: NAD  Fundus Fundal Tone: Firm @ u-2  Lochia Amount: Small         Data Review Labs: Lab Results  Component Value Date   WBC 22.1 (H) 06/08/2021   HGB 10.4 (L) 06/08/2021   HCT 29.4 (L) 06/08/2021   MCV 90.7 06/08/2021   PLT 159 06/08/2021   CBC Latest Ref Rng & Units 06/08/2021 06/07/2021 03/23/2021  WBC 4.0 - 10.5 K/uL 22.1(H) 13.2(H) 9.2  Hemoglobin 12.0 - 15.0 g/dL 10.4(L) 12.3 11.5  Hematocrit 36.0 - 46.0 % 29.4(L) 35.5(L) 33.9(L)  Platelets 150 - 400 K/uL 159 157 197   O POS  Edinburgh Score: Edinburgh Postnatal Depression Scale Screening Tool 06/08/2021  I have been able to laugh and see the funny side of things. (No Data)  I have looked forward with enjoyment to things. -  I have blamed myself unnecessarily when things went wrong. -  I have been anxious or worried for no good reason. -  I have felt scared or panicky for no good reason. -  Things have been getting on top of me. -  I have been so unhappy that I have had difficulty sleeping. -  I have felt sad or miserable. -  I have been so unhappy that I have been crying. -  The thought of harming myself has occurred to me. 08/09/2021 Postnatal Depression Scale Total -    Assessment/Plan  Active  Problems:   Normal labor   Spontaneous vaginal delivery   [redacted] weeks gestation of pregnancy    Plan for discharge today.  Discharge Instructions: Per After Visit Summary. Activity: Advance as tolerated. Pelvic rest for 6 weeks.  Also refer to After Visit Summary Diet: Regular Medications: Allergies as of 06/09/2021   No Known Allergies      Medication List     TAKE these medications    docusate sodium 100 MG capsule Commonly known as: COLACE Take 1 capsule (100 mg total) by mouth 2 (two) times daily.   ferrous sulfate 325 (65 FE) MG tablet Take 1 tablet (325 mg total) by mouth daily.   ibuprofen 600 MG tablet Commonly known as: ADVIL Take 1 tablet (600 mg total) by mouth every 6 (six) hours.   medroxyPROGESTERone 150 MG/ML injection Commonly known as: DEPO-PROVERA Inject 1 mL (150 mg total) into the muscle every 3 (three) months.   multivitamin-prenatal 27-0.8 MG Tabs tablet Take 1 tablet by mouth daily at 12 noon.       Outpatient follow up: 2 week tele visit , 6 wk postpartum visit with 08/10/2021  Postpartum contraception: Depo injection, first dose give prior to discharge  Discharged Condition:  good  Discharged to: home  Newborn Data: Disposition:home with mother  Apgars: APGAR (1 MIN): 9   APGAR (5 MINS): 9   APGAR (10 MINS):    Baby Feeding: Breast    Doreene Burke, CNM 06/09/2021 7:35 AM

## 2021-06-09 NOTE — Progress Notes (Signed)
Mother discharged.  Discharge instructions given.  Mother verbalizes understanding.  Transported by auxiliary.  

## 2021-06-11 ENCOUNTER — Encounter: Payer: Medicaid Other | Admitting: Certified Nurse Midwife

## 2021-06-29 ENCOUNTER — Encounter: Payer: Self-pay | Admitting: Certified Nurse Midwife

## 2021-06-29 ENCOUNTER — Encounter: Payer: Medicaid Other | Admitting: Certified Nurse Midwife

## 2021-07-21 ENCOUNTER — Encounter: Payer: Self-pay | Admitting: Certified Nurse Midwife

## 2021-07-21 ENCOUNTER — Encounter: Payer: Medicaid Other | Admitting: Certified Nurse Midwife

## 2021-09-07 ENCOUNTER — Other Ambulatory Visit (HOSPITAL_COMMUNITY)
Admission: RE | Admit: 2021-09-07 | Discharge: 2021-09-07 | Disposition: A | Discharge status: Home / Self Care | Payer: Medicaid Other | Source: Ambulatory Visit | Attending: Certified Nurse Midwife | Admitting: Certified Nurse Midwife

## 2021-09-07 ENCOUNTER — Encounter: Payer: Self-pay | Admitting: Certified Nurse Midwife

## 2021-09-07 ENCOUNTER — Other Ambulatory Visit: Payer: Self-pay

## 2021-09-07 ENCOUNTER — Ambulatory Visit (INDEPENDENT_AMBULATORY_CARE_PROVIDER_SITE_OTHER): Payer: Medicaid Other | Admitting: Certified Nurse Midwife

## 2021-09-07 DIAGNOSIS — Z124 Encounter for screening for malignant neoplasm of cervix: Secondary | ICD-10-CM | POA: Insufficient documentation

## 2021-09-07 DIAGNOSIS — Z01419 Encounter for gynecological examination (general) (routine) without abnormal findings: Secondary | ICD-10-CM

## 2021-09-07 NOTE — Progress Notes (Signed)
GYNECOLOGY ANNUAL PREVENTATIVE CARE ENCOUNTER NOTE  History:     Susan Santos is a 20 y.o. [phone] female here for a routine annual gynecologic exam.  Current complaints: none.   Denies abnormal vaginal bleeding, discharge, pelvic pain, problems with intercourse or other gynecologic concerns.     Social Relationship: single Living: her mom and her children Work: not currently, starts with fedex next week  Exercise: none Smoke/Alcohol/drug use: denies use.   Gynecologic History No LMP recorded. Contraception: Depo-Provera injections, concerned about weight gain.  Will use condoms, state she is not sexually active.  Last Pap: has not had.  Last mammogram: n/a    The pregnancy intention screening data noted above was reviewed. Potential methods of contraception were discussed. The patient elected to proceed with unsure .  Obstetric History OB History  Gravida Para Term Preterm AB Living  3 2 2  0 1 2  SAB IAB Ectopic Multiple Live Births  0 0 0 0 2    # Outcome Date GA Lbr Len/2nd Weight Sex Delivery Anes PTL Lv  3 Term 06/07/21 [redacted]w[redacted]d 02:06 / 00:18 7 lb 15.7 oz (3.62 kg) M Vag-Spont None  LIV  2 AB 2020          1 Term 07/25/17 [redacted]w[redacted]d 14:48 / 04:40 8 lb 6.4 oz (3.81 kg) F Vag-Vacuum None  LIV    Past Medical History:  Diagnosis Date   Gallstones    Postpartum anemia 07/26/2017   Vaginal delivery 2018   girl    Past Surgical History:  Procedure Laterality Date   CHOLECYSTECTOMY N/A 06/11/2019   Procedure: LAPAROSCOPIC CHOLECYSTECTOMY;  Surgeon: 08/12/2019, MD;  Location: [facility] ORS;  Service: General;  Laterality: N/A;   NO PAST SURGERIES      Current Outpatient Medications on File Prior to Visit  Medication Sig Dispense Refill   docusate sodium (COLACE) 100 MG capsule Take 1 capsule (100 mg total) by mouth 2 (two) times daily. 10 capsule 0   ferrous sulfate 325 (65 FE) MG tablet Take 1 tablet (325 mg total) by mouth daily. 30 tablet 3   ibuprofen (ADVIL) 600 MG  tablet Take 1 tablet (600 mg total) by mouth every 6 (six) hours. 30 tablet 0   medroxyPROGESTERone (DEPO-PROVERA) 150 MG/ML injection Inject 1 mL (150 mg total) into the muscle every 3 (three) months. 1 mL 3   Prenatal Vit-Fe Fumarate-FA (MULTIVITAMIN-PRENATAL) 27-0.8 MG TABS tablet Take 1 tablet by mouth daily at 12 noon. 30 tablet 12   No current facility-administered medications on file prior to visit.    No Known Allergies  Social History:  reports that she has never smoked. She has never used smokeless tobacco. She reports that she does not currently use alcohol. She reports that she does not use drugs.  Family History  Problem Relation Age of Onset   Diabetes Mother    Diabetes Maternal Uncle    Diabetes Maternal Grandmother    Breast cancer Neg Hx    Hypertension Neg Hx     The following portions of the patient's history were reviewed and updated as appropriate: allergies, current medications, past family history, past medical history, past social history, past surgical history and problem list.  Review of Systems Pertinent items noted in HPI and remainder of comprehensive ROS otherwise negative.  Physical Exam:  BP 109/67 P 56, wt 158.6  CONSTITUTIONAL: Well-developed, well-nourished female in no acute distress.  HENT:  Normocephalic, atraumatic, External right and left ear normal.  Oropharynx is clear and moist EYES: Conjunctivae and EOM are normal. Pupils are equal, round, and reactive to light. No scleral icterus.  NECK: Normal range of motion, supple, no masses.  Normal thyroid.  SKIN: Skin is warm and dry. No rash noted. Not diaphoretic. No erythema. No pallor. MUSCULOSKELETAL: Normal range of motion. No tenderness.  No cyanosis, clubbing, or edema.  2+ distal pulses. NEUROLOGIC: Alert and oriented to person, place, and time. Normal reflexes, muscle tone coordination.  PSYCHIATRIC: Normal mood and affect. Normal behavior. Normal judgment and thought  content. CARDIOVASCULAR: Normal heart rate noted, regular rhythm RESPIRATORY: Clear to auscultation bilaterally. Effort and breath sounds normal, no problems with respiration noted. BREASTS: Symmetric in size. No masses, tenderness, skin changes, nipple drainage, or lymphadenopathy bilaterally.  ABDOMEN: Soft, no distention noted.  No tenderness, rebound or guarding.  PELVIC: Normal appearing external genitalia and urethral meatus; normal appearing vaginal mucosa and cervix.  No abnormal discharge noted.  Pap smear obtained.  Normal uterine size, no other palpable masses, no uterine or adnexal tenderness.  .   Assessment and Plan:    1. Women's annual routine gynecological examination   Pap:Will follow up results of pap smear and manage accordingly. Mammogram : n/a  Labs: declines  Refills:  none, decline flu Referral: none Routine preventative health maintenance measures emphasized. Please refer to After Visit Summary for other counseling recommendations.      Doreene Burke, CNM [facility],  [facility]

## 2021-09-07 NOTE — Patient Instructions (Signed)
Preventive Care 21-22 Years Old, Female Preventive care refers to lifestyle choices and visits with your health care provider that can promote health and wellness. This includes: A yearly physical exam. This is also called an annual wellness visit. Regular dental and eye exams. Immunizations. Screening for certain conditions. Healthy lifestyle choices, such as: Eating a healthy diet. Getting regular exercise. Not using drugs or products that contain nicotine and tobacco. Limiting alcohol use. What can I expect for my preventive care visit? Physical exam Your health care provider may check your: Height and weight. These may be used to calculate your BMI (body mass index). BMI is a measurement that tells if you are at a healthy weight. Heart rate and blood pressure. Body temperature. Skin for abnormal spots. Counseling Your health care provider may ask you questions about your: Past medical problems. Family's medical history. Alcohol, tobacco, and drug use. Emotional well-being. Home life and relationship well-being. Sexual activity. Diet, exercise, and sleep habits. Work and work environment. Access to firearms. Method of birth control. Menstrual cycle. Pregnancy history. What immunizations do I need? Vaccines are usually given at various ages, according to a schedule. Your health care provider will recommend vaccines for you based on your age, medical history, and lifestyle or other factors, such as travel or where you work. What tests do I need? Blood tests Lipid and cholesterol levels. These may be checked every 5 years starting at age 20. Hepatitis C test. Hepatitis B test. Screening Diabetes screening. This is done by checking your blood sugar (glucose) after you have not eaten for a while (fasting). STD (sexually transmitted disease) testing, if you are at risk. BRCA-related cancer screening. This may be done if you have a family history of breast, ovarian, tubal, or  peritoneal cancers. Pelvic exam and Pap test. This may be done every 3 years starting at age 21. Starting at age 30, this may be done every 5 years if you have a Pap test in combination with an HPV test. Talk with your health care provider about your test results, treatment options, and if necessary, the need for more tests. Follow these instructions at home: Eating and drinking  Eat a healthy diet that includes fresh fruits and vegetables, whole grains, lean protein, and low-fat dairy products. Take vitamin and mineral supplements as recommended by your health care provider. Do not drink alcohol if: Your health care provider tells you not to drink. You are pregnant, may be pregnant, or are planning to become pregnant. If you drink alcohol: Limit how much you have to 0-1 drink a day. Be aware of how much alcohol is in your drink. In the [place] one drink equals one 12 oz bottle of beer (355 mL), one 5 oz glass of wine (148 mL), or one 1 oz glass of hard liquor (44 mL). Lifestyle Take daily care of your teeth and gums. Brush your teeth every morning and night with fluoride toothpaste. Floss one time each day. Stay active. Exercise for at least 30 minutes 5 or more days each week. Do not use any products that contain nicotine or tobacco, such as cigarettes, e-cigarettes, and chewing tobacco. If you need help quitting, ask your health care provider. Do not use drugs. If you are sexually active, practice safe sex. Use a condom or other form of protection to prevent STIs (sexually transmitted infections). If you do not wish to become pregnant, use a form of birth control. If you plan to become pregnant, see your health care provider   for a prepregnancy visit. Find healthy ways to cope with stress, such as: Meditation, yoga, or listening to music. Journaling. Talking to a trusted person. Spending time with friends and family. Safety Always wear your seat belt while driving or riding in a  vehicle. Do not drive: If you have been drinking alcohol. Do not ride with someone who has been drinking. When you are tired or distracted. While texting. Wear a helmet and other protective equipment during sports activities. If you have firearms in your house, make sure you follow all gun safety procedures. Seek help if you have been physically or sexually abused. What's next? Go to your health care provider once a year for an annual wellness visit. Ask your health care provider how often you should have your eyes and teeth checked. Stay up to date on all vaccines. This information is not intended to replace advice given to you by your health care provider. Make sure you discuss any questions you have with your health care provider. Document Revised: 01/30/2021 Document Reviewed: 08/03/2018 Elsevier Patient Education  2022 Elsevier Inc.  

## 2021-09-10 LAB — CYTOLOGY - PAP
Diagnosis: NEGATIVE
Diagnosis: REACTIVE

## 2021-11-07 IMAGING — US US OB < 14 WEEKS - US OB TV
1 series · 14 of 28 positions shown · non-contrast
Comparison: None.

CLINICAL DATA: Abdominal/pelvic pain

EXAM:
OBSTETRIC <14 WK US AND TRANSVAGINAL OB US
TECHNIQUE: Both transabdominal and transvaginal ultrasound examinations were
performed for complete evaluation of the gestation as well as the
maternal uterus, adnexal regions, and pelvic cul-de-sac.
Transvaginal technique was performed to assess early pregnancy.

[Series 1: us ob < 14 weeks - us ob tv · 14 of 77 slices shown]
[im 3/77]
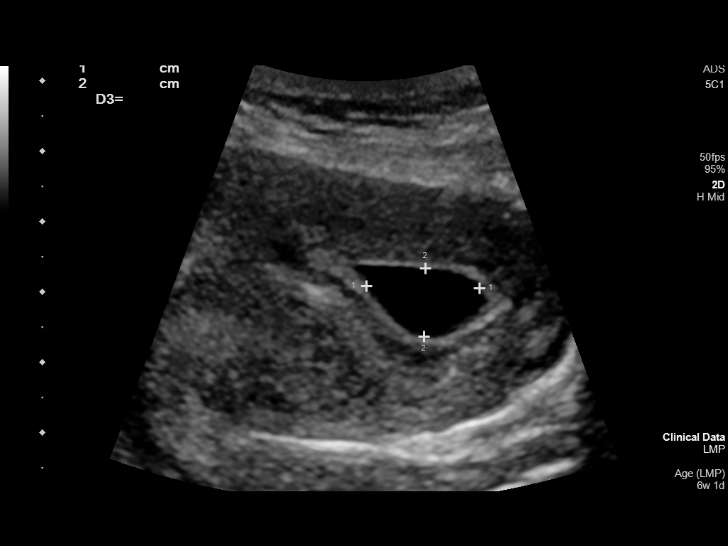
[im 9/77]
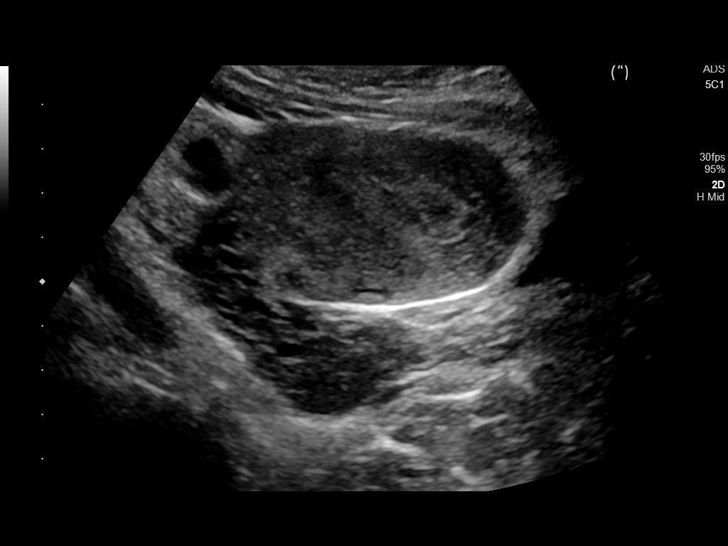
[im 15/77]
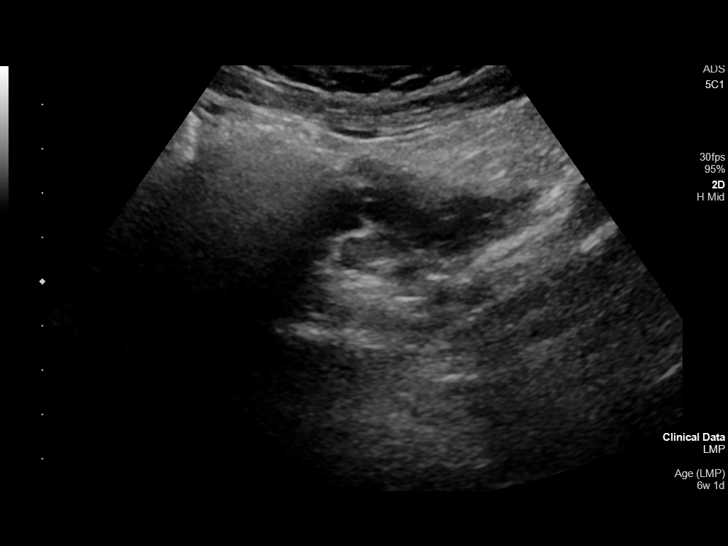
[im 20/77]
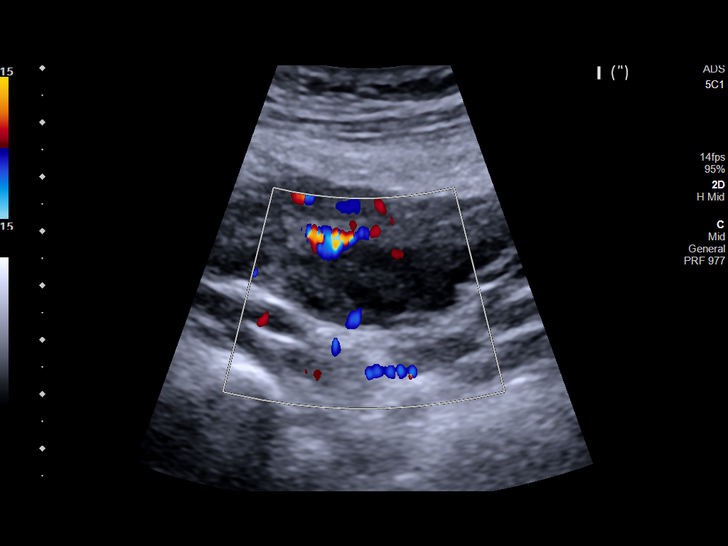
[im 26/77]
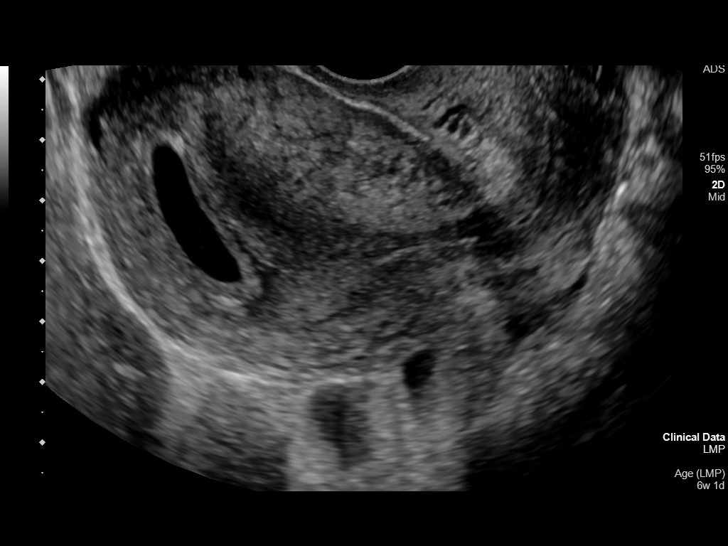
[im 31/77]
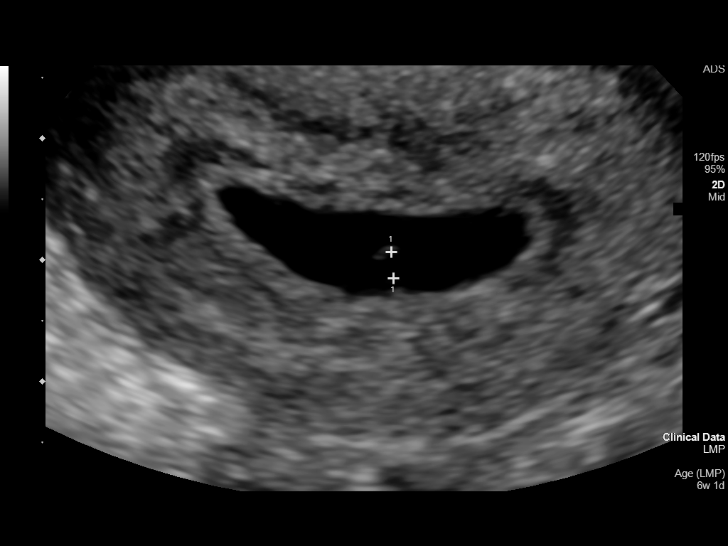
[im 37/77]
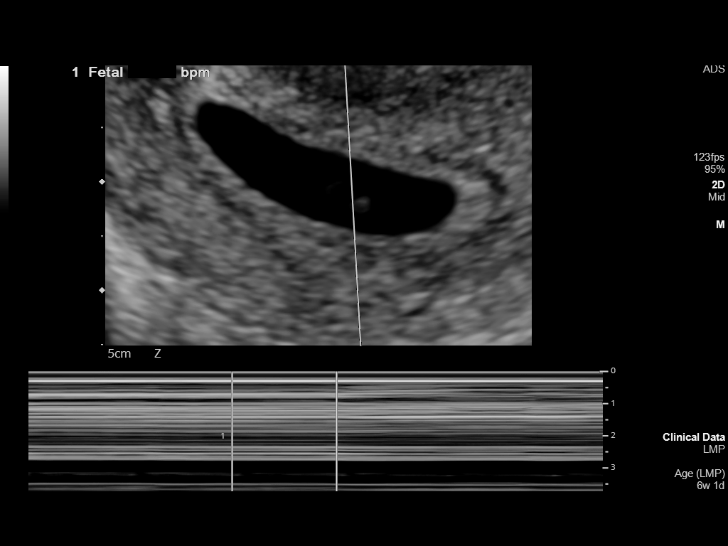
[im 43/77]
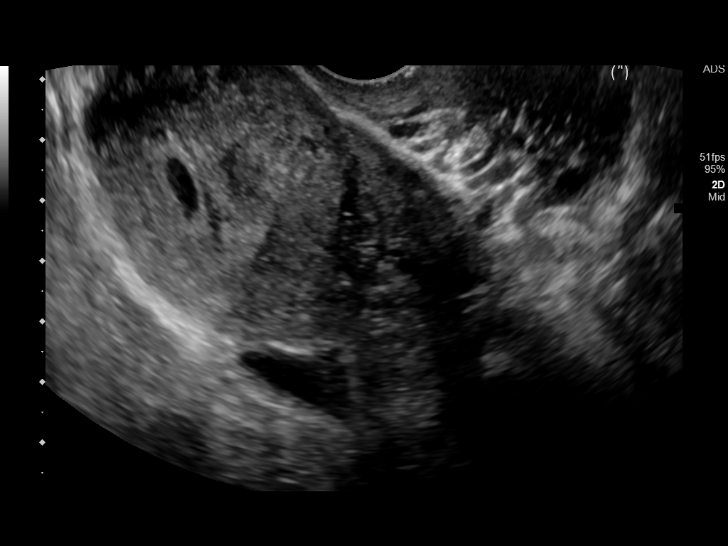
[im 48/77]
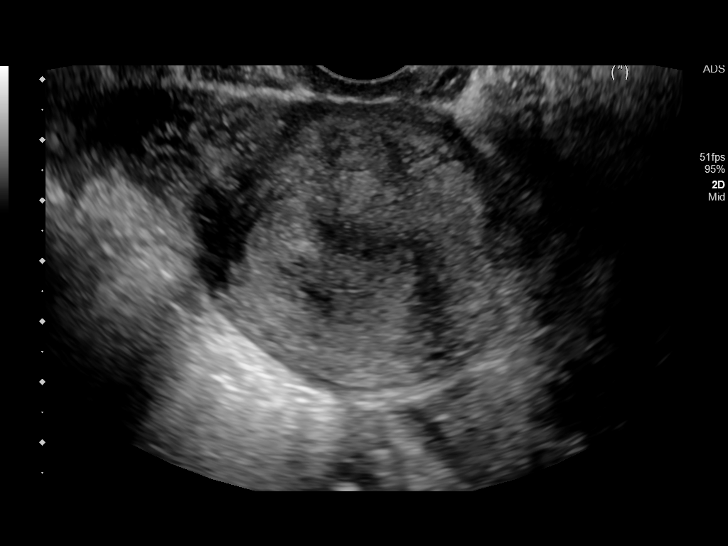
[im 54/77]
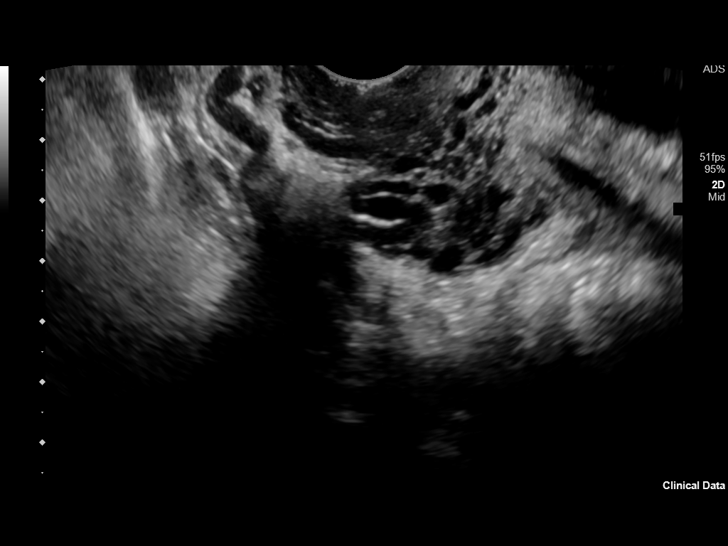
[im 60/77]
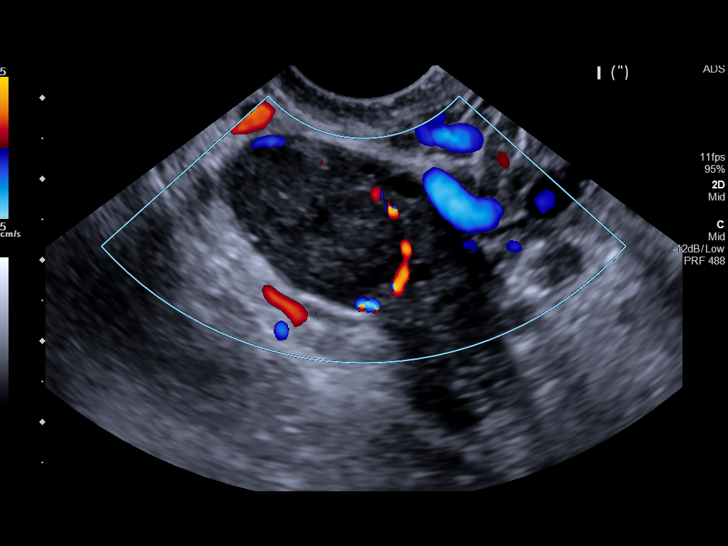
[im 65/77]
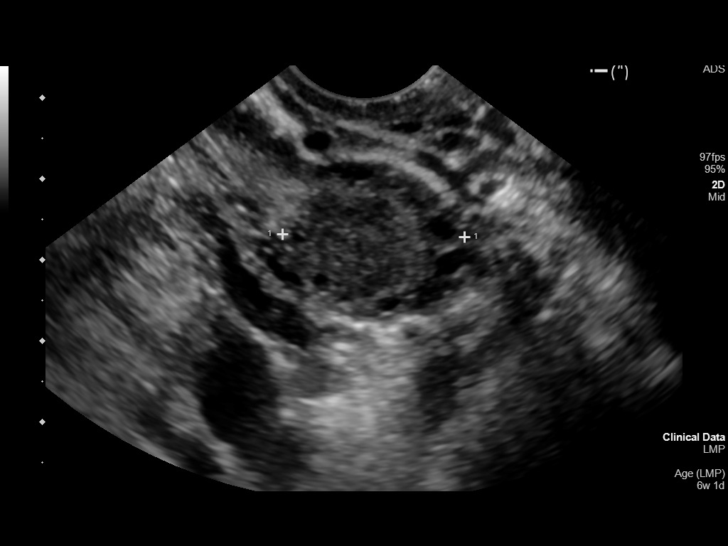
[im 71/77]
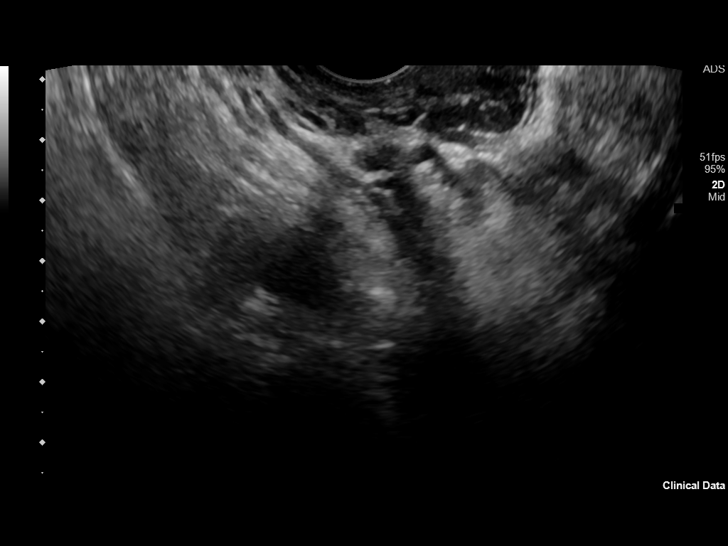
[im 77/77]
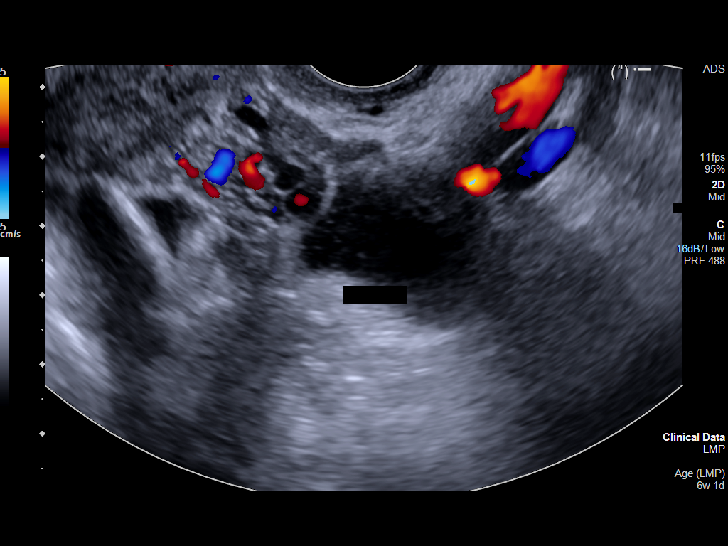

[14 of 28 positions shown; findings below may reference images not displayed]

FINDINGS: Intrauterine gestational sac: Visualized

Yolk sac:  Visualized

Embryo:  Visualized

Cardiac Activity: Visualized

Heart Rate: 98 bpm

CRL:  2 mm   5 w   5 d                  US EDC: June 26, 2020

Subchorionic hemorrhage:  None visualized.

Maternal uterus/adnexae: Right ovary measures 3.2 x 1.9 x 2.3 cm.
Suspect hemorrhagic corpus luteum on the right measuring 2.4 x 1.6 x
1.6 cm. Left ovary could not be visualized by transabdominal
transvaginal technique. No left-sided pelvic mass evident. No
appreciable free pelvic fluid.
IMPRESSION: 1. Single live intrauterine gestation with estimated gestational age
of 6-weeks. No evidence of chorionic hemorrhage.

2. Probable hemorrhagic corpus luteum on the right measuring 2.4 x
1.6 x 1.6 cm.

## 2022-03-22 ENCOUNTER — Emergency Department
Admission: EM | Admit: 2022-03-22 | Discharge: 2022-03-22 | Disposition: A | Discharge status: Home / Self Care | Payer: Medicaid Other | Attending: Emergency Medicine | Admitting: Emergency Medicine

## 2022-03-22 ENCOUNTER — Other Ambulatory Visit: Payer: Self-pay

## 2022-03-22 DIAGNOSIS — F10129 Alcohol abuse with intoxication, unspecified: Secondary | ICD-10-CM | POA: Diagnosis not present

## 2022-03-22 DIAGNOSIS — Y906 Blood alcohol level of 120-199 mg/100 ml: Secondary | ICD-10-CM | POA: Diagnosis not present

## 2022-03-22 DIAGNOSIS — R112 Nausea with vomiting, unspecified: Secondary | ICD-10-CM | POA: Diagnosis present

## 2022-03-22 DIAGNOSIS — F10929 Alcohol use, unspecified with intoxication, unspecified: Secondary | ICD-10-CM

## 2022-03-22 LAB — CBC WITH DIFFERENTIAL/PLATELET
Abs Immature Granulocytes: 0.03 10*3/uL (ref 0.00–0.07)
Basophils Absolute: 0.1 10*3/uL (ref 0.0–0.1)
Basophils Relative: 1 %
Eosinophils Absolute: 0 10*3/uL (ref 0.0–0.5)
Eosinophils Relative: 0 %
HCT: 41.6 % (ref 36.0–46.0)
Hemoglobin: 13.7 g/dL (ref 12.0–15.0)
Immature Granulocytes: 0 %
Lymphocytes Relative: 13 %
Lymphs Abs: 1.2 10*3/uL (ref 0.7–4.0)
MCH: 27.8 pg (ref 26.0–34.0)
MCHC: 32.9 g/dL (ref 30.0–36.0)
MCV: 84.4 fL (ref 80.0–100.0)
Monocytes Absolute: 0.2 10*3/uL (ref 0.1–1.0)
Monocytes Relative: 2 %
Neutro Abs: 8 10*3/uL — ABNORMAL HIGH (ref 1.7–7.7)
Neutrophils Relative %: 84 %
Platelets: 309 10*3/uL (ref 150–400)
RBC: 4.93 MIL/uL (ref 3.87–5.11)
RDW: 13.4 % (ref 11.5–15.5)
WBC: 9.4 10*3/uL (ref 4.0–10.5)
nRBC: 0 % (ref 0.0–0.2)

## 2022-03-22 LAB — BASIC METABOLIC PANEL
Anion gap: 9 (ref 5–15)
BUN: 10 mg/dL (ref 6–20)
CO2: 25 mmol/L (ref 22–32)
Calcium: 9.1 mg/dL (ref 8.9–10.3)
Chloride: 107 mmol/L (ref 98–111)
Creatinine, Ser: 0.72 mg/dL (ref 0.44–1.00)
GFR, Estimated: >60 mL/min (ref >60)
Glucose, Bld: 137 mg/dL — ABNORMAL HIGH (ref 70–99)
Potassium: 4.1 mmol/L (ref 3.5–5.1)
Sodium: 141 mmol/L (ref 135–145)

## 2022-03-22 LAB — ETHANOL: Alcohol, Ethyl (B): 155 mg/dL — ABNORMAL HIGH (ref <10)

## 2022-03-22 MED ORDER — ONDANSETRON 4 MG PO TBDP: 4.0000 mg | ORAL_TABLET | Freq: Three times a day (TID) | ORAL | 0 refills | Status: DC | PRN

## 2022-03-22 MED ORDER — LACTATED RINGERS IV BOLUS
1000.0000 mL | Freq: Once | INTRAVENOUS | Status: AC
  Administered 2022-03-22: 1000 mL via INTRAVENOUS

## 2022-03-22 MED ORDER — ONDANSETRON HCL 4 MG/2ML IJ SOLN
4.0000 mg | INTRAMUSCULAR | Status: AC
  Administered 2022-03-22: 4 mg via INTRAVENOUS
  Filled 2022-03-22: qty 2

## 2022-03-22 NOTE — ED Notes (Signed)
Patient able to ambulate to restroom with no problems. No problems with PO intake of apple juice and crackers ?

## 2022-03-22 NOTE — ED Provider Notes (Signed)
Assumed care from Dr. York Cerise at 7 AM. Briefly, the patient is a 21 y.o. female with PMHx of  has a past medical history of Gallstones, Postpartum anemia (07/26/2017), and Vaginal delivery (2018). here with altered mental status, likely 2/2 alcohol intoxication and possible THC use. Screening labs sent. Protecting airway. Plan to likely monitor for sobriety and d/c.  ? ?Labs Reviewed  ?CBC WITH DIFFERENTIAL/PLATELET  ?ETHANOL  ?BASIC METABOLIC PANEL  ?URINE DRUG SCREEN, QUALITATIVE ([facility] ONLY)  ?POC URINE PREG, ED  ? ? ?Course of Care: ?-CBC unremarkable.  BMP unremarkable.  Ethanol pending. ?-Ethanol level 155.  Patient, however, feels improved.  She is ambulatory.  She is tolerating p.o.  Denies any pain other than a mild headache which is suspect is related to her dehydration and alcohol use.  She has been given fluids.  Will discharge with supportive care and Zofran as needed. ? ?  ?Shaune Pollack, MD ?03/22/22 [phone] ? ?

## 2022-03-22 NOTE — ED Triage Notes (Signed)
Patient reports patient had been out and thinks she might have had too much to drink. ?

## 2022-03-22 NOTE — ED Provider Notes (Signed)
? ?[facility] ?Provider Note ? ? ? Event Date/Time  ? First MD Initiated Contact with Patient 03/22/22 [phone]   ?  (approximate) ? ? ?History  ? ?Emesis ? ? ?HPI ? ?Susan Santos is a 21 y.o. female with no contributory past medical history who presents for evaluation of persistent vomiting.  Her friend is with her and they went out to a club in [place] last night.  The patient reportedly did not have anything to eat all day and then had 7-8 shots of tequila.  She states this is the normal amount she drinks when she goes out, but she also smokes something out of a hookah that "was strawberry flavored" but they do not know what was in it.  Afterwards, the friend, who had much less to drink, also vomited, but she stopped.  The patient has continued to vomit and feels "out of it". ? ?She denies pain.  She has not had any falls or trauma of any kind.  She denies chest pain and shortness of breath as well as abdominal pain.  No history of assault. ?  ? ? ?Physical Exam  ? ?Triage Vital Signs: ?ED Triage Vitals  ?Enc Vitals Group  ?   BP 03/22/22 0617 109/72  ?   Pulse Rate 03/22/22 0615 (P) 79  ?   Resp 03/22/22 0617 18  ?   Temp 03/22/22 0615 (P) 98.2 ?F (36.8 ?C)  ?   Temp Source 03/22/22 0615 (P) Oral  ?   SpO2 03/22/22 0617 99 %  ?   Weight 03/22/22 0606 72.6 kg (160 lb)  ?   Height 03/22/22 0606 1.575 m (5\' 2" )  ?   Head Circumference --   ?   Peak Flow --   ?   Pain Score 03/22/22 0606 0  ?   Pain Loc --   ?   Pain Edu? --   ?   Excl. in [place]? --   ? ? ?Most recent vital signs: ?Vitals:  ? 03/22/22 0615 03/22/22 0617  ?BP:  109/72  ?Pulse: (P) 79 82  ?Resp:  18  ?Temp: (P) 98.2 ?F (36.8 ?C) 98.2 ?F (36.8 ?C)  ?SpO2:  99%  ? ? ? ?General: Awake but seems somewhat disconnected from what is going on.  Able to answer questions without difficulty. ?Eyes:  Pupils are reactive but slightly sluggish.  Extraocular movement is intact. ?CV:  Good peripheral perfusion.  ?Resp:  Normal effort.  ?Abd:  No  distention.  No tenderness to palpation. ? ? ?ED Results / Procedures / Treatments  ? ?Labs ?(all labs ordered are listed, but only abnormal results are displayed) ?Labs Reviewed  ?CBC WITH DIFFERENTIAL/PLATELET - Abnormal; Notable for the following components:  ?    Result Value  ? Neutro Abs 8.0 (*)   ? All other components within normal limits  ?ETHANOL - Abnormal; Notable for the following components:  ? Alcohol, Ethyl (B) 155 (*)   ? All other components within normal limits  ?BASIC METABOLIC PANEL - Abnormal; Notable for the following components:  ? Glucose, Bld 137 (*)   ? All other components within normal limits  ?URINE DRUG SCREEN, QUALITATIVE ([facility] ONLY)  ?POC URINE PREG, ED  ? ? ? ?PROCEDURES: ? ?Critical Care performed: No ? ?Procedures ? ? ?MEDICATIONS ORDERED IN ED: ?Medications  ?ondansetron (ZOFRAN) injection 4 mg (4 mg Intravenous Given 03/22/22 0702)  ?lactated ringers bolus 1,000 mL (1,000 mLs Intravenous New Bag/Given 03/22/22  5400)  ? ? ? ?IMPRESSION / MDM / ASSESSMENT AND PLAN / ED COURSE  ?I reviewed the triage vital signs and the nursing notes. ?             ?               ? ?Differential diagnosis includes, but is not limited to, alcohol intoxication, other nonspecific substance abuse, electrolyte or metabolic abnormality. ? ?Patient's vital signs are stable.  She is clearly intoxicated with something but is protecting her airway and able to answer questions and respond appropriately.  I suspect some sort of synthetic marijuana was in the hookah, as well as what sounds like a substantial alcohol intake in the setting of no food over the course of the day. ? ?I explained to the patient and her friend that her behavior is dangerous and could lead to serious problems or complications down the road.  She says that she understands.  We will check basic labs, provide LR 1 L bolus, Zofran 4 mg IV for persistent nausea, monitor her pulse oximeter, and obtain a urine drug screen and POC pregnancy  test if the patient provide urine but she does not need an out catheterization. ? ?Will observe for a period of time until she appears clinically sober.  Assuming labs are normal and she has a reassuring exam she will likely be able to be discharged. ? ?Transferring ED care to Dr. Erma Heritage at 7 AM to follow-up on labs and reassess the patient. ? ? ? ? ?  ? ? ?FINAL CLINICAL IMPRESSION(S) / ED DIAGNOSES  ? ?Final diagnoses:  ?Alcoholic intoxication with complication (HCC)  ?Nausea and vomiting, unspecified vomiting type  ? ? ? ?Rx / DC Orders  ? ?ED Discharge Orders   ? ? None  ? ?  ? ? ? ?Note:  This document was prepared using Dragon voice recognition software and may include unintentional dictation errors. ?  ?Loleta Rose, MD ?03/22/22 [phone] ? ?

## 2022-09-30 ENCOUNTER — Emergency Department: Payer: Medicaid Other

## 2022-09-30 ENCOUNTER — Encounter: Payer: Self-pay | Admitting: Emergency Medicine

## 2022-09-30 ENCOUNTER — Other Ambulatory Visit: Payer: Self-pay

## 2022-09-30 DIAGNOSIS — Z20822 Contact with and (suspected) exposure to covid-19: Secondary | ICD-10-CM | POA: Insufficient documentation

## 2022-09-30 DIAGNOSIS — J029 Acute pharyngitis, unspecified: Secondary | ICD-10-CM | POA: Insufficient documentation

## 2022-09-30 DIAGNOSIS — R052 Subacute cough: Secondary | ICD-10-CM | POA: Insufficient documentation

## 2022-09-30 NOTE — ED Triage Notes (Signed)
Patient ambulatory to triage with steady gait, without difficulty or distress noted; pt reports x wk having nonprod cough; denies any accomp symptoms

## 2022-10-01 ENCOUNTER — Emergency Department
Admission: EM | Admit: 2022-10-01 | Discharge: 2022-10-01 | Disposition: A | Discharge status: Home / Self Care | Payer: Medicaid Other | Attending: Emergency Medicine | Admitting: Emergency Medicine

## 2022-10-01 DIAGNOSIS — R052 Subacute cough: Secondary | ICD-10-CM

## 2022-10-01 LAB — RESP PANEL BY RT-PCR (FLU A&B, COVID) ARPGX2
Influenza A by PCR: NEGATIVE
Influenza B by PCR: NEGATIVE
SARS Coronavirus 2 by RT PCR: NEGATIVE

## 2022-10-01 NOTE — Discharge Instructions (Signed)
Take Robitussin, find over-the-counter for cough.  Thank you for choosing [place] for your health care today!  Please see your primary doctor this week for a follow up appointment.   If you do not have a primary doctor call the following clinics to establish care:  If you have insurance:  [facility] [phone] [place] [address]   [facility]  [phone] [place] [address]   If you do not have insurance:  [facility]  [phone] [address] [place] [address]  Sometimes, in the early stages of certain disease courses it is difficult to detect in the emergency department evaluation -- so, it is important that you continue to monitor your symptoms and call your doctor right away or return to the emergency department if you develop any new or worsening symptoms.  It was my pleasure to care for you today.   Hoover Brunette Jacelyn Grip, MD

## 2022-10-01 NOTE — ED Notes (Signed)
Pt dc to home. Dc instructions reviewed with all questions answered. Pt verbalizes understanding. Pt ambulatory out of dept with steady gait

## 2022-10-01 NOTE — ED Provider Notes (Signed)
[facility] Provider Note    Event Date/Time   First MD Initiated Contact with Patient 10/01/22 0022     (approximate)   History   Cough   HPI  Susan Santos is a 21 y.o. female   Past medical history of healthy young woman who presents with 1 week of cough.  At the beginning of her symptoms also had associated sore throat, no fever or chills, no shortness of breath or chest pain.  No nasal congestion.  Has tried cough and cold medicine over-the-counter without relief.  She is non-smoker and has no history of asthma.    History was obtained via pt External medical notes reviewed at this time including emergency department visit in April 2023 for emesis.      Physical Exam   Triage Vital Signs: ED Triage Vitals  Enc Vitals Group     BP --      Pulse --      Resp --      Temp --      Temp src --      SpO2 --      Weight 09/30/22 2305 150 lb (68 kg)     Height 09/30/22 2305 5\' 2"  (1.575 m)     Head Circumference --      Peak Flow --      Pain Score 09/30/22 2306 0     Pain Loc --      Pain Edu? --      Excl. in [place]? --     Most recent vital signs: There were no vitals filed for this visit.  General: Awake, no distress.  CV:  Good peripheral perfusion.  Resp:  Normal effort.  Clear to auscultation without focality or wheezing.  No respiratory distress.  Comfortable Abd:  No distention.  Other:  Nontoxic-appearing.  Oropharynx appears normal without erythema or exudates.  Neck supple with full range of motion.  No fever.   ED Results / Procedures / Treatments   Labs (all labs ordered are listed, but only abnormal results are displayed) Labs Reviewed  RESP PANEL BY RT-PCR (FLU A&B, COVID) [id number]     I reviewed labs and they are notable for a respiratory viral panel including flu and COVID negative  RADIOLOGY I independently reviewed and interpreted chest x-ray and see no focal opacity   PROCEDURES:  Critical Care performed:  No  Procedures   MEDICATIONS ORDERED IN ED: Medications - No data to display   IMPRESSION / MDM / [place] / ED COURSE  I reviewed the triage vital signs and the nursing notes.                              Differential diagnosis includes, but is not limited to, acute bronchitis, viral respiratory infection, pneumonia, asthma exacerbation considered but less likely ACS, PE    MDM: X-ray negative viral swabs with residual cough after viral upper respiratory infectious symptoms.  Acute, less than 10 days, non-smoker and no signs of asthma or wheezing on my auscultation.  Anticipatory guidance given, follow-up with PMD.  Dispo: After careful consideration of this patient's presentation, medical and social risk factors, and evaluation in the emergency department I engaged in shared decision making with the patient and/or their representative to consider admission or observation and this patient was ultimately discharged because As above was negative for acute emergent pathology and plan for outpatient  therapy and PMD follow-up.   Patient's presentation is most consistent with acute presentation with potential threat to life or bodily function.       FINAL CLINICAL IMPRESSION(S) / ED DIAGNOSES   Final diagnoses:  Subacute cough     Rx / DC Orders   ED Discharge Orders     None        Note:  This document was prepared using Dragon voice recognition software and may include unintentional dictation errors.    Lucillie Garfinkel, MD 10/01/22 [phone]

## 2023-02-18 IMAGING — US US OB COMP +14 WK
1 series · 13 of 28 positions shown · non-contrast
Comparison: none

CLINICAL DATA: Fetal anatomic survey

EXAM:
OBSTETRICAL ULTRASOUND >14 WKS

[Series 1: us ob comp + 14 wk · 13 of 78 slices shown]
[im 3/78]
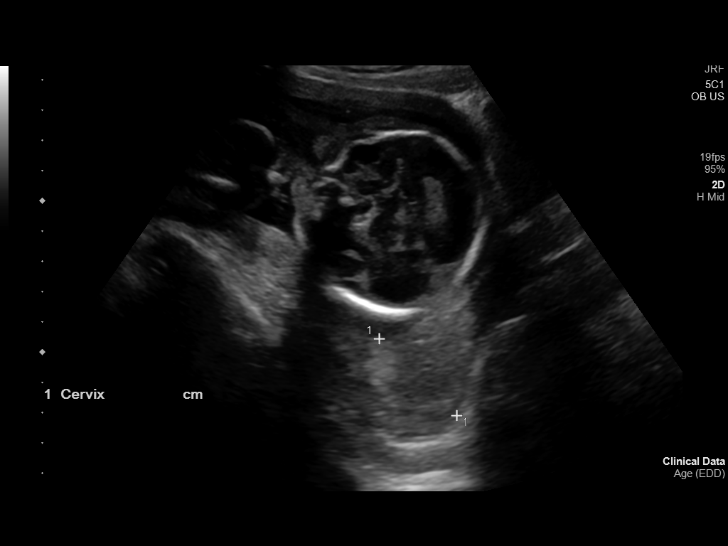
[im 9/78]
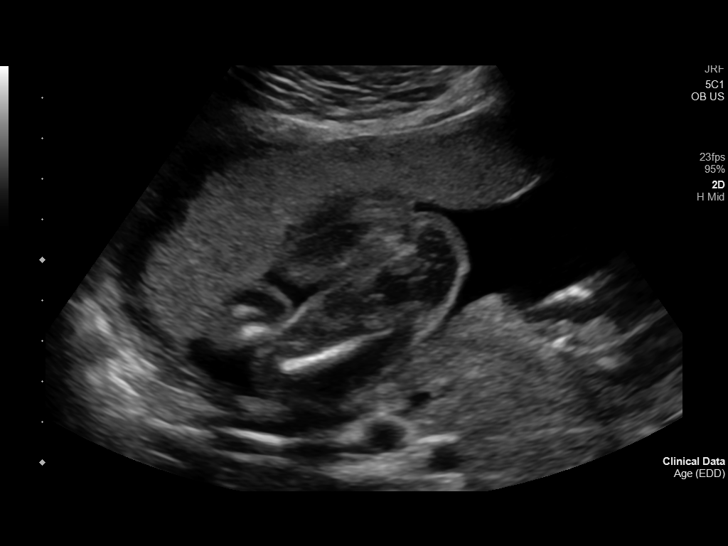
[im 15/78]
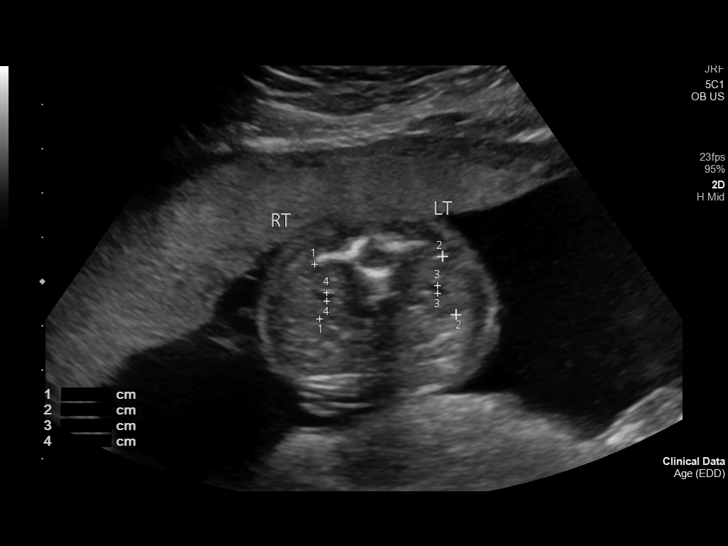
[im 20/78]
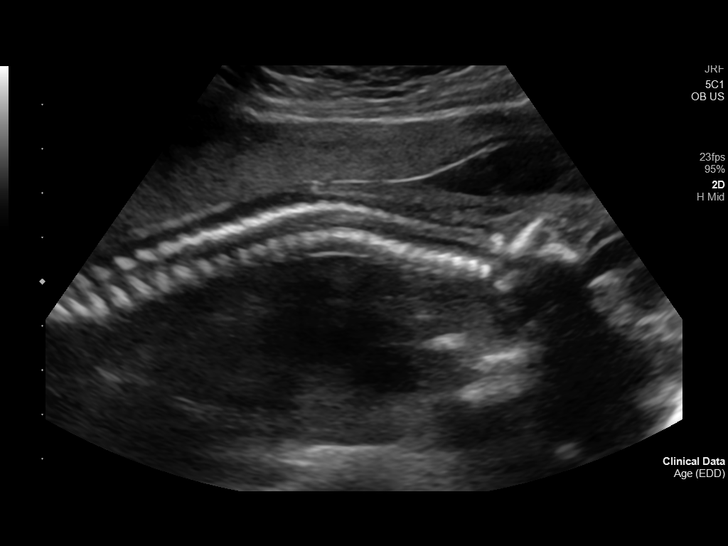
[im 26/78]
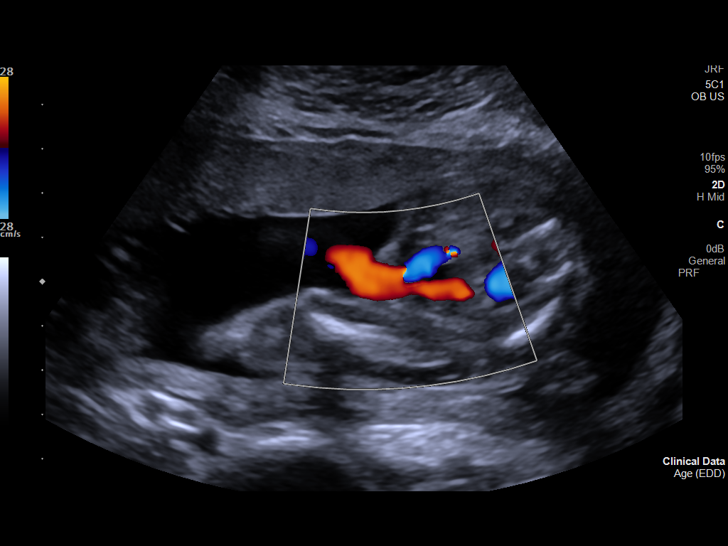
[im 32/78]
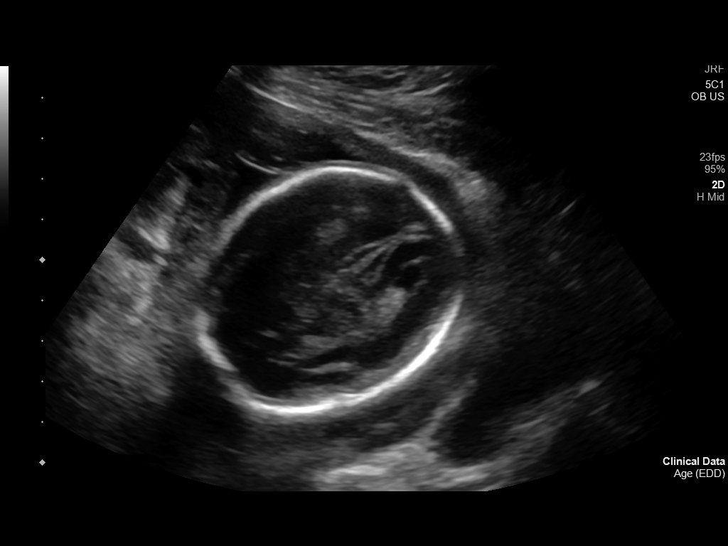
[im 40/78]
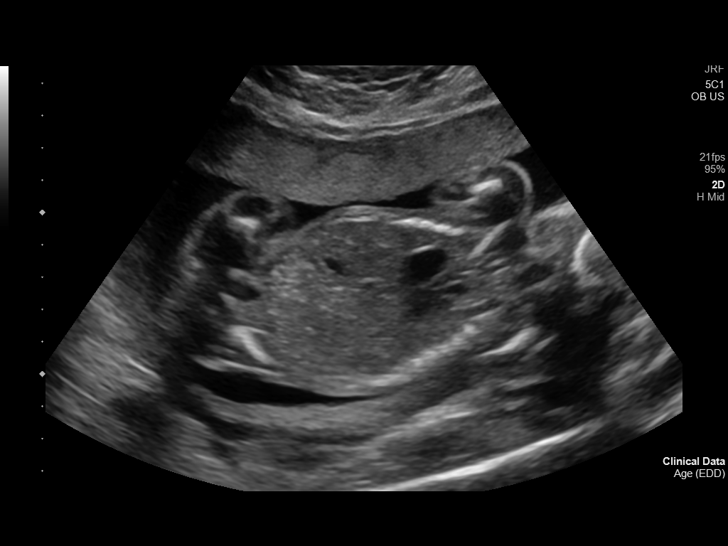
[im 46/78]
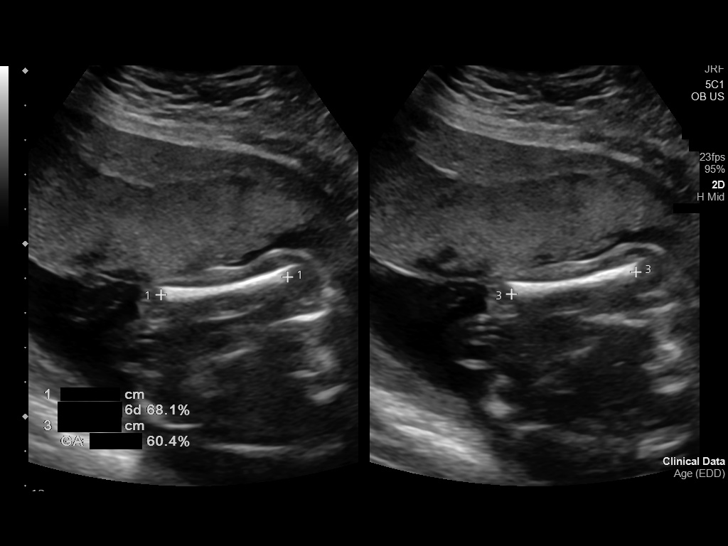
[im 52/78]
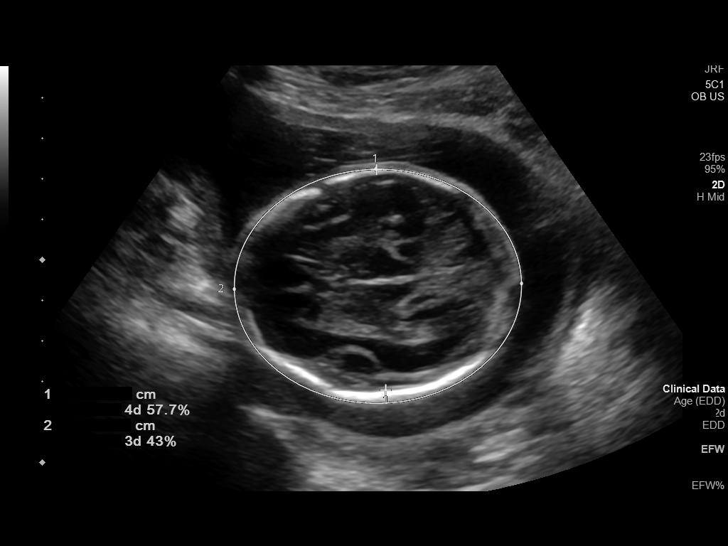
[im 58/78]
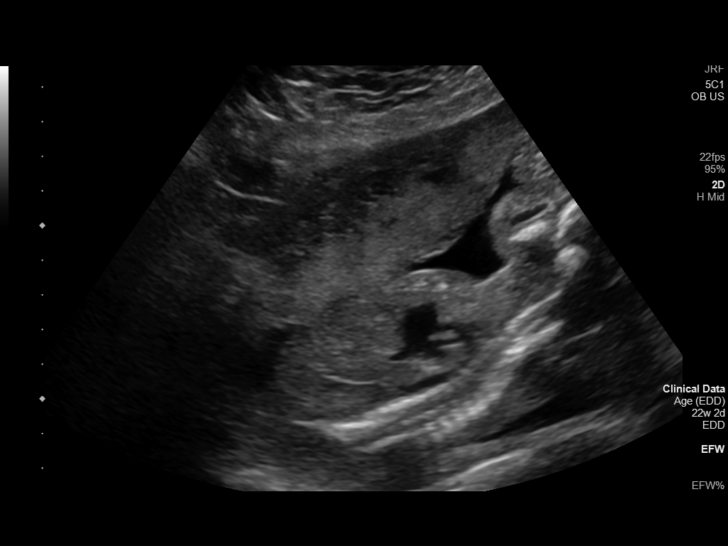
[im 63/78]
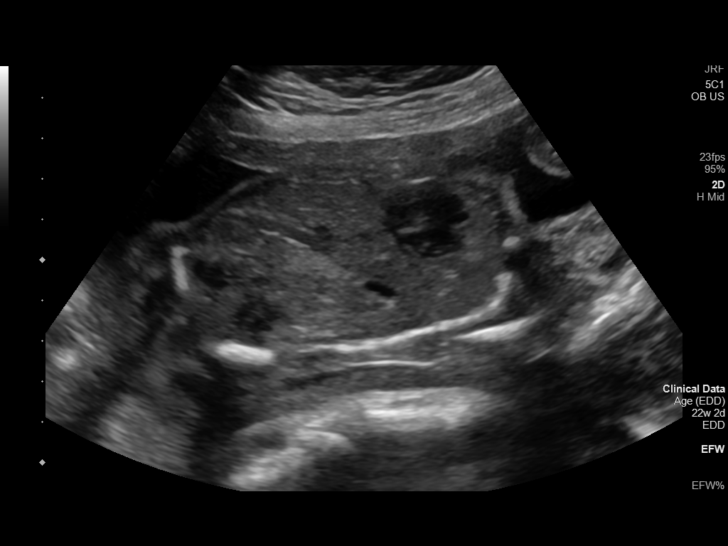
[im 69/78]
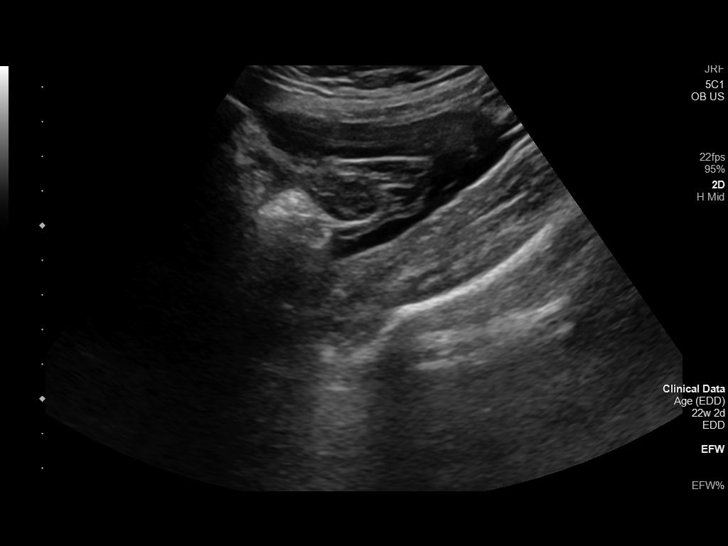
[im 75/78]
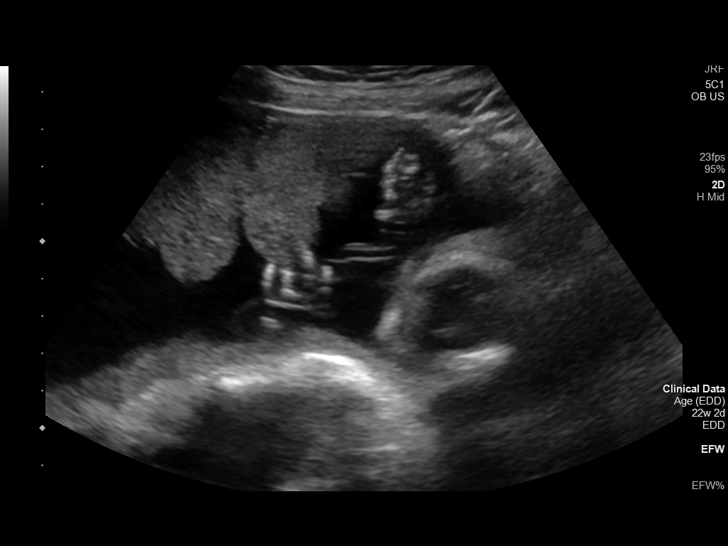

[13 of 28 positions shown; findings below may reference images not displayed]

FINDINGS: Number of Fetuses: 1

Heart Rate:  153 bpm

Movement: Yes

Presentation: Cephalic

Previa: No

Placental Location: Anterior

Amniotic Fluid (Subjective): Normal

Amniotic Fluid (Objective):

Vertical pocket = 5.8cm

FETAL BIOMETRY

BPD: 5.5cm 22w 4d

HC:   20.3cm 22w 3d

AC:   16.6cm 21w 4d

FL:   3.9cm 22w 4d

Current Mean GA: 22w 2d US EDC: 06/13/2021

Assigned GA:  22w 2d Assigned EDC: 06/13/2021

FETAL ANATOMY

Lateral Ventricles: Appears normal

Thalami/CSP: Appears normal

Posterior Fossa:  Appears normal

Nuchal Region: Appears normal

Upper Lip: Appears normal.  Profile not well visualized.

Spine: Appears normal

4 Chamber Heart on Left: Appears normal

LVOT: Appears normal

RVOT: Appears normal

Stomach on Left: Appears normal

3 Vessel Cord: Appears normal

Cord Insertion site: Appears normal

Kidneys: Appears normal

Bladder: Appears normal

Extremities: Appears normal

Sex: Male

Technically difficult due to: Fetal position

Maternal Findings:

Cervix:  3.6 cm
IMPRESSION: 1. Single live intrauterine pregnancy as above, estimated age 22
weeks and 2 days.
2. Unremarkable fetal anatomic survey, no fetal anomalies detected.

## 2023-03-12 ENCOUNTER — Encounter: Payer: Self-pay | Admitting: Emergency Medicine

## 2023-03-12 ENCOUNTER — Emergency Department
Admission: EM | Admit: 2023-03-12 | Discharge: 2023-03-12 | Disposition: A | Discharge status: Home / Self Care | Payer: Medicaid Other | Attending: Emergency Medicine | Admitting: Emergency Medicine

## 2023-03-12 DIAGNOSIS — S3023XA Contusion of vagina and vulva, initial encounter: Secondary | ICD-10-CM | POA: Diagnosis not present

## 2023-03-12 DIAGNOSIS — N7689 Other specified inflammation of vagina and vulva: Secondary | ICD-10-CM | POA: Diagnosis present

## 2023-03-12 DIAGNOSIS — X58XXXA Exposure to other specified factors, initial encounter: Secondary | ICD-10-CM | POA: Diagnosis not present

## 2023-03-12 DIAGNOSIS — N9089 Other specified noninflammatory disorders of vulva and perineum: Secondary | ICD-10-CM

## 2023-03-12 LAB — POC URINE PREG, ED: Preg Test, Ur: NEGATIVE

## 2023-03-12 MED ORDER — NAPROXEN 250 MG PO TABS: 250.0000 mg | ORAL_TABLET | Freq: Two times a day (BID) | ORAL | 0 refills | Status: AC

## 2023-03-12 MED ORDER — HYDROMORPHONE HCL 1 MG/ML IJ SOLN
1.0000 mg | Freq: Once | INTRAMUSCULAR | Status: AC
  Administered 2023-03-12: 1 mg via INTRAVENOUS
  Filled 2023-03-12: qty 1

## 2023-03-12 MED ORDER — FENTANYL CITRATE PF 50 MCG/ML IJ SOSY
50.0000 ug | PREFILLED_SYRINGE | Freq: Once | INTRAMUSCULAR | Status: AC
  Administered 2023-03-12: 50 ug via INTRAVENOUS
  Filled 2023-03-12: qty 1

## 2023-03-12 MED ORDER — OXYCODONE HCL 5 MG PO TABS: 5.0000 mg | ORAL_TABLET | Freq: Three times a day (TID) | ORAL | 0 refills | Status: AC | PRN

## 2023-03-12 NOTE — ED Triage Notes (Signed)
Pt presents ambulatory to triage via POV with complaints of labial swelling that started yesterday. Pt endorses more swelling after shaving last night. No drainage or fevers.

## 2023-03-12 NOTE — ED Notes (Signed)
Pt has marks on her neck and her R side of face. Her swelling is from a hematoma according to Dr. Sidney Ace.  Pt denies any trauma after being asked repeatedly if she has experienced any

## 2023-03-12 NOTE — Discharge Instructions (Signed)
You have a hematoma on the vulva.  Please take Tylenol or naproxen for pain.  You can take the oxycodone as well but please do not drive after taking this medication.  Try to rest and ice the area to help with swelling.  This is going to take several weeks potentially months to completely resolve.  Please follow-up with OB/GYN in the next week or so for reevaluation.  Your pain is not controlled or swelling worsens or you are not able to urinate then please return the emergency department.
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
## 2023-03-12 NOTE — ED Notes (Signed)
Pt has large abscess to L labia causing pt great discomfort
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
## 2023-03-12 NOTE — ED Notes (Signed)
Introduced self to pt. Pt walked to toilet in room and urinated . Pt had significant pain with walking. Helped pt dab off urine from swollen labia with wet washcloth. Pt back in bed. Provided blankets.

## 2023-03-16 ENCOUNTER — Emergency Department
Admission: EM | Admit: 2023-03-16 | Discharge: 2023-03-16 | Disposition: A | Discharge status: Home / Self Care | Payer: Medicaid Other | Attending: Emergency Medicine | Admitting: Emergency Medicine

## 2023-03-16 ENCOUNTER — Encounter: Payer: Self-pay | Admitting: Intensive Care

## 2023-03-16 ENCOUNTER — Other Ambulatory Visit: Payer: Self-pay

## 2023-03-16 DIAGNOSIS — S3095XD Unspecified superficial injury of vagina and vulva, subsequent encounter: Secondary | ICD-10-CM | POA: Diagnosis present

## 2023-03-16 DIAGNOSIS — K651 Peritoneal abscess: Secondary | ICD-10-CM | POA: Diagnosis not present

## 2023-03-16 DIAGNOSIS — X58XXXD Exposure to other specified factors, subsequent encounter: Secondary | ICD-10-CM | POA: Diagnosis not present

## 2023-03-16 DIAGNOSIS — S3023XD Contusion of vagina and vulva, subsequent encounter: Secondary | ICD-10-CM | POA: Insufficient documentation

## 2023-03-16 NOTE — Discharge Instructions (Addendum)
You are to be followed-up by Dr. Logan Bores next week. You should call the office tomorrow morning to confirm an appointment day and time. Continue to take the prescription medicine as directed. Apply cool compresses to reduce pain and swelling.
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
## 2023-03-16 NOTE — ED Triage Notes (Signed)
C/o left labia abscess. This morning she states there was bloody drainage from site.

## 2023-03-22 ENCOUNTER — Encounter
Admission: RE | Admit: 2023-03-22 | Discharge: 2023-03-22 | Disposition: A | Discharge status: Home / Self Care | Payer: Medicaid Other | Source: Ambulatory Visit | Attending: Obstetrics and Gynecology | Admitting: Obstetrics and Gynecology

## 2023-03-22 ENCOUNTER — Encounter: Payer: Self-pay | Admitting: Obstetrics and Gynecology

## 2023-03-22 ENCOUNTER — Other Ambulatory Visit: Payer: Self-pay

## 2023-03-22 ENCOUNTER — Ambulatory Visit: Payer: Medicaid Other | Admitting: Obstetrics and Gynecology

## 2023-03-22 VITALS — Ht 62.0 in | Wt 150.0 lb

## 2023-03-22 VITALS — BP 122/80 | HR 112 | Ht 62.0 in | Wt 150.0 lb

## 2023-03-22 DIAGNOSIS — Z01818 Encounter for other preprocedural examination: Secondary | ICD-10-CM

## 2023-03-22 DIAGNOSIS — N9089 Other specified noninflammatory disorders of vulva and perineum: Secondary | ICD-10-CM

## 2023-03-22 NOTE — H&P (Signed)
PRE-OPERATIVE HISTORY AND PHYSICAL EXAM  PCP:  Patient, No Pcp Per Subjective:   HPI:  Susan Santos is a 22 y.o. [id number].  Patient's last menstrual period was 02/28/2023.  She presents today for a pre-op discussion and PE.  She has the following symptoms: Large left labial hematoma  Review of Systems:   Constitutional: Denied constitutional symptoms, night sweats, recent illness, fatigue, fever, insomnia and weight loss.  Eyes: Denied eye symptoms, eye pain, photophobia, vision change and visual disturbance.  Ears/Nose/Throat/Neck: Denied ear, nose, throat or neck symptoms, hearing loss, nasal discharge, sinus congestion and sore throat.  Cardiovascular: Denied cardiovascular symptoms, arrhythmia, chest pain/pressure, edema, exercise intolerance, orthopnea and palpitations.  Respiratory: Denied pulmonary symptoms, asthma, pleuritic pain, productive sputum, cough, dyspnea and wheezing.  Gastrointestinal: Denied, gastro-esophageal reflux, melena, nausea and vomiting.  Genitourinary: See HPI for additional information.  Musculoskeletal: Denied musculoskeletal symptoms, stiffness, swelling, muscle weakness and myalgia.  Dermatologic: Denied dermatology symptoms, rash and scar.  Neurologic: Denied neurology symptoms, dizziness, headache, neck pain and syncope.  Psychiatric: Denied psychiatric symptoms, anxiety and depression.  Endocrine: Denied endocrine symptoms including hot flashes and night sweats.   OB History  Gravida Para Term Preterm AB Living  0 1 2  SAB IAB Ectopic Multiple Live Births  0 0 0 0 2    # Outcome Date GA Lbr Len/2nd Weight Sex Delivery Anes PTL Lv  3 Term 06/07/21 [redacted]w[redacted]d 02:06 / 00:18 7 lb 15.7 oz (3.62 kg) M Vag-Spont None  LIV  2 AB 2020          1 Term 07/25/17 [redacted]w[redacted]d 14:48 / 04:40 8 lb 6.4 oz (3.81 kg) F Vag-Vacuum None  LIV    Past Medical History:  Diagnosis Date   Gallstones    Postpartum anemia 07/26/2017   Vaginal delivery 2018   girl     Past Surgical History:  Procedure Laterality Date   CHOLECYSTECTOMY N/A 06/11/2019   Procedure: LAPAROSCOPIC CHOLECYSTECTOMY;  Surgeon: Henrene Dodge, MD;  Location: [facility] ORS;  Service: General;  Laterality: N/A;   NO PAST SURGERIES        SOCIAL HISTORY:  Social History   Tobacco Use  Smoking Status Never  Smokeless Tobacco Never   Social History   Substance and Sexual Activity  Alcohol Use Yes   Comment: occasional    Social History   Substance and Sexual Activity  Drug Use No    Family History  Problem Relation Age of Onset   Diabetes Mother    Diabetes Maternal Uncle    Diabetes Maternal Grandmother    Breast cancer Neg Hx    Hypertension Neg Hx     ALLERGIES:  Patient has no known allergies.  MEDS:   Current Outpatient Medications on File Prior to Visit  Medication Sig Dispense Refill   ibuprofen (ADVIL) 600 MG tablet Take 1 tablet (600 mg total) by mouth every 6 (six) hours. 30 tablet 0   naproxen (NAPROSYN) 250 MG tablet Take 1 tablet (250 mg total) by mouth 2 (two) times daily with a meal for 10 days. 20 tablet 0   docusate sodium (COLACE) 100 MG capsule Take 1 capsule (100 mg total) by mouth 2 (two) times daily. (Patient not taking: Reported on 03/22/2023) 10 capsule 0   ferrous sulfate 325 (65 FE) MG tablet Take 1 tablet (325 mg total) by mouth daily. 30 tablet 3   medroxyPROGESTERone (DEPO-PROVERA) 150 MG/ML injection Inject 1 mL (150  mg total) into the muscle every 3 (three) months. (Patient not taking: Reported on 03/22/2023) 1 mL 3   ondansetron (ZOFRAN-ODT) 4 MG disintegrating tablet Take 1 tablet (4 mg total) by mouth every 8 (eight) hours as needed for nausea or vomiting. (Patient not taking: Reported on 03/22/2023) 8 tablet 0   Prenatal Vit-Fe Fumarate-FA (MULTIVITAMIN-PRENATAL) 27-0.8 MG TABS tablet Take 1 tablet by mouth daily at 12 noon. (Patient not taking: Reported on 03/22/2023) 30 tablet 12   No current facility-administered medications on  file prior to visit.    No orders of the defined types were placed in this encounter.    Physical examination BP 122/80   Pulse (!) 112   Ht  (1.575 m)   Wt 150 lb (68 kg)   LMP 02/28/2023   BMI 27.44 kg/m   General NAD, Conversant  HEENT Atraumatic; Op clear with mmm.  Normo-cephalic. Pupils reactive. Anicteric sclerae  Thyroid/Neck Smooth without nodularity or enlargement. Normal ROM.  Neck Supple.  Skin No rashes, lesions or ulceration. Normal palpated skin turgor. No nodularity.  Breasts: No masses or discharge.  Symmetric.  No axillary adenopathy.  Lungs: Clear to auscultation.No rales or wheezes. Normal Respiratory effort, no retractions.  Heart: NSR.  No murmurs or rubs appreciated. No peripheral edema  Abdomen: Soft.  Non-tender.  No masses.  No HSM. No hernia  Extremities: Moves all appropriately.  Normal ROM for age. No lymphadenopathy.  Neuro: Oriented to PPT.  Normal mood. Normal affect.     Pelvic:   Vulva: Grossly swollen left labia consistent with large vulvar hematoma.  Now bleeding and oozing blood as a transudate  Vagina: No lesions or abnormalities noted.  Support: Normal pelvic support.  Urethra No masses tenderness or scarring.  Meatus Normal size without lesions or prolapse.  Cervix: Normal ectropion.  No lesions.  Anus: Normal exam.  No lesions.  Perineum: Normal exam.  No lesions.        Bimanual   Uterus: Normal size.  Non-tender.  Mobile.  AV.  Adnexae: No masses.  Non-tender to palpation.  Cul-de-sac: Negative for abnormality.   Assessment:   [id number] Patient Active Problem List   Diagnosis Date Noted   Symptomatic cholelithiasis 03/07/2019    1. Vulvar hematoma      Large vulvar hematoma that occurred after intercourse.  It has showed no evidence of resolution since it occurred.  Plan:   Orders: No orders of the defined types were placed in this encounter.    1.  Evacuation of vulvar hematoma -labial repair

## 2023-03-22 NOTE — Patient Instructions (Addendum)
Your procedure is scheduled on: Wednesday 03/23/23 To find out your arrival time, please call [phone] between 1PM - 3PM on:   Tuesday 03/22/23 Report to the Registration Desk on the 1st floor of the [facility]. Valet parking is available.  If your arrival time is 6:00 am, do not arrive before that time as the [facility] entrance doors do not open until 6:00 am.  REMEMBER: Instructions that are not followed completely may result in serious medical risk, up to and including death; or upon the discretion of your surgeon and anesthesiologist your surgery may need to be rescheduled.  Do not eat food or drink any liquids after midnight the night before surgery.  No gum chewing or hard candies.  One week prior to surgery: Stop Anti-inflammatories (NSAIDS) such as Advil, Aleve, Ibuprofen, Motrin, Naproxen, Naprosyn and Aspirin based products such as Excedrin, Goody's Powder, BC Powder. You may however, continue to take Tylenol if needed for pain up until the day of surgery.  Stop ANY OVER THE COUNTER supplements until after surgery.  Continue taking all prescribed medications. Stop the Naproxen today. May use Tylenol if needed.   TAKE ONLY THESE MEDICATIONS THE MORNING OF SURGERY WITH A SIP OF WATER:  NONE  No Alcohol for 24 hours before or after surgery.  No Smoking including e-cigarettes for 24 hours before surgery.  No chewable tobacco products for at least 6 hours before surgery.  No nicotine patches on the day of surgery.  Do not use any "recreational" drugs for at least a week (preferably 2 weeks) before your surgery.  Please be advised that the combination of cocaine and anesthesia may have negative outcomes, up to and including death. If you test positive for cocaine, your surgery will be cancelled.  On the morning of surgery brush your teeth with toothpaste and water, you may rinse your mouth with mouthwash if you wish. Do not swallow any toothpaste or mouthwash.  Use  CHG Soap or wipes as directed on instruction sheet. Shower the morning of your procedure with your regular soap.  Do not wear lotions, powders, or perfumes.   Do not shave body hair from the neck down 48 hours before surgery.  Wear comfortable clothing (specific to your surgery type) to the hospital.  Do not wear jewelry, make-up, hairpins, clips or nail polish.  Contact lenses, hearing aids and dentures may not be worn into surgery.  Do not bring valuables to the hospital. [facility] is not responsible for any missing/lost belongings or valuables.   Notify your doctor if there is any change in your medical condition (cold, fever, infection).  If you are being discharged the day of surgery, you will not be allowed to drive home. You will need a responsible individual to drive you home and stay with you for 24 hours after surgery.   If you are taking public transportation, you will need to have a responsible individual with you.  If you are being admitted to the hospital overnight, leave your suitcase in the car. After surgery it may be brought to your room.  In case of increased patient census, it may be necessary for you, the patient, to continue your postoperative care in the Same Day Surgery department.  After surgery, you can help prevent lung complications by doing breathing exercises.  Take deep breaths and cough every 1-2 hours. Your doctor may order a device called an Incentive Spirometer to help you take deep breaths. When coughing or sneezing, hold a  pillow firmly against your incision with both hands. This is called "splinting." Doing this helps protect your incision. It also decreases belly discomfort.  Surgery Visitation Policy:  Patients undergoing a surgery or procedure may have two family members or support persons with them as long as the person is not COVID-19 positive or experiencing its symptoms.   Inpatient Visitation:    Visiting hours are 7 a.m. to 8  p.m. Up to four visitors are allowed at one time in a patient room. The visitors may rotate out with other people during the day. One designated support person (adult) may remain overnight.  Please call the Pre-admissions Testing Dept. at [phone] if you have any questions about these instructions.

## 2023-03-22 NOTE — Progress Notes (Signed)
Susan Santos, Susan Pcp Per Subjective:   HPI:  CORTASIA SCREWS is a 22 y.o. [id number].  Santos's last menstrual period was 02/28/2023.  She presents today for a pre-op discussion and PE.  She has the following symptoms: Large left labial hematoma  Review of Systems:   Constitutional: Denied constitutional symptoms, night sweats, recent illness, fatigue, fever, insomnia and weight loss.  Eyes: Denied eye symptoms, eye pain, photophobia, vision change and visual disturbance.  Ears/Nose/Throat/Neck: Denied ear, nose, throat or neck symptoms, hearing loss, nasal discharge, sinus congestion and sore throat.  Cardiovascular: Denied cardiovascular symptoms, arrhythmia, chest pain/pressure, edema, exercise intolerance, orthopnea and palpitations.  Respiratory: Denied pulmonary symptoms, asthma, pleuritic pain, productive sputum, cough, dyspnea and wheezing.  Gastrointestinal: Denied, gastro-esophageal reflux, melena, nausea and vomiting.  Genitourinary: See HPI for additional information.  Musculoskeletal: Denied musculoskeletal symptoms, stiffness, swelling, muscle weakness and myalgia.  Dermatologic: Denied dermatology symptoms, rash and scar.  Neurologic: Denied neurology symptoms, dizziness, headache, neck pain and syncope.  Psychiatric: Denied psychiatric symptoms, anxiety and depression.  Endocrine: Denied endocrine symptoms including hot flashes and night sweats.   OB History  Gravida Para Term Preterm AB Living  0 1 2  SAB IAB Ectopic Multiple Live Births  0 0 0 0 2    # Outcome Date GA Lbr Len/2nd Weight Sex Delivery Anes PTL Lv  3 Term 06/07/21 [redacted]w[redacted]d 02:06 / 00:18 7 lb 15.7 oz (3.62 kg) M Vag-Spont None  LIV  2 AB 2020          1 Term 07/25/17 [redacted]w[redacted]d 14:48 / 04:40 8 lb 6.4 oz (3.81 kg) F Vag-Vacuum None  LIV    Past Medical History:  Diagnosis Date   Gallstones    Postpartum anemia 07/26/2017   Vaginal delivery 2018   girl     Past Surgical History:  Procedure Laterality Date   CHOLECYSTECTOMY N/A 06/11/2019   Procedure: LAPAROSCOPIC CHOLECYSTECTOMY;  Surgeon: Henrene Dodge, MD;  Location: [facility] ORS;  Service: General;  Laterality: N/A;   Susan PAST SURGERIES        SOCIAL HISTORY:  Social History   Tobacco Use  Smoking Status Never  Smokeless Tobacco Never   Social History   Substance and Sexual Activity  Alcohol Use Yes   Comment: occasional    Social History   Substance and Sexual Activity  Drug Use Susan    Family History  Problem Relation Age of Onset   Diabetes Mother    Diabetes Maternal Uncle    Diabetes Maternal Grandmother    Breast cancer Neg Hx    Hypertension Neg Hx     ALLERGIES:  Santos has Susan known allergies.  MEDS:   Current Outpatient Medications on File Prior to Visit  Medication Sig Dispense Refill   ibuprofen (ADVIL) 600 MG tablet Take 1 tablet (600 mg total) by mouth every 6 (six) hours. 30 tablet 0   naproxen (NAPROSYN) 250 MG tablet Take 1 tablet (250 mg total) by mouth 2 (two) times daily with a meal for 10 days. 20 tablet 0   docusate sodium (COLACE) 100 MG capsule Take 1 capsule (100 mg total) by mouth 2 (two) times daily. (Santos not taking: Reported on 03/22/2023) 10 capsule 0   ferrous sulfate 325 (65 FE) MG tablet Take 1 tablet (325 mg total) by mouth daily. 30 tablet 3   medroxyPROGESTERone (DEPO-PROVERA) 150 MG/ML injection Inject 1 mL (150  mg total) into the muscle every 3 (three) months. (Santos not taking: Reported on 03/22/2023) 1 mL 3   ondansetron (ZOFRAN-ODT) 4 MG disintegrating tablet Take 1 tablet (4 mg total) by mouth every 8 (eight) hours as needed for nausea or vomiting. (Santos not taking: Reported on 03/22/2023) 8 tablet 0   Prenatal Vit-Fe Fumarate-FA (MULTIVITAMIN-PRENATAL) 27-0.8 MG TABS tablet Take 1 tablet by mouth daily at 12 noon. (Santos not taking: Reported on 03/22/2023) 30 tablet 12   Susan current facility-administered medications on  file prior to visit.    Susan orders of the defined types were placed in this encounter.    Physical examination BP 122/80   Pulse (!) 112   Ht  (1.575 m)   Wt 150 lb (68 kg)   LMP 02/28/2023   BMI 27.44 kg/m   General NAD, Conversant  HEENT Atraumatic; Op clear with mmm.  Normo-cephalic. Pupils reactive. Anicteric sclerae  Thyroid/Neck Smooth without nodularity or enlargement. Normal ROM.  Neck Supple.  Skin Susan rashes, lesions or ulceration. Normal palpated skin turgor. Susan nodularity.  Breasts: Susan masses or discharge.  Symmetric.  Susan axillary adenopathy.  Lungs: Clear to auscultation.Susan rales or wheezes. Normal Respiratory effort, Susan retractions.  Heart: NSR.  Susan murmurs or rubs appreciated. Susan peripheral edema  Abdomen: Soft.  Non-tender.  Susan masses.  Susan HSM. Susan hernia  Extremities: Moves all appropriately.  Normal ROM for age. Susan lymphadenopathy.  Neuro: Oriented to PPT.  Normal mood. Normal affect.     Pelvic:   Vulva: Grossly swollen left labia consistent with large vulvar hematoma.  Now bleeding and oozing blood as a transudate  Vagina: Susan lesions or abnormalities noted.  Support: Normal pelvic support.  Urethra Susan masses tenderness or scarring.  Meatus Normal size without lesions or prolapse.  Cervix: Normal ectropion.  Susan lesions.  Anus: Normal exam.  Susan lesions.  Perineum: Normal exam.  Susan lesions.        Bimanual   Uterus: Normal size.  Non-tender.  Mobile.  AV.  Adnexae: Susan masses.  Non-tender to palpation.  Cul-de-sac: Negative for abnormality.   Assessment:   [id number] Santos Active Problem List   Diagnosis Date Noted   Symptomatic cholelithiasis 03/07/2019    1. Vulvar hematoma      Large vulvar hematoma that occurred after intercourse.  It has showed Susan evidence of resolution since it occurred.  Plan:   Orders: Susan orders of the defined types were placed in this encounter.    1.  Evacuation of vulvar hematoma -labial repair  Pre-op  discussions regarding Risks and Benefits of her scheduled surgery.  We discussed the risk of recurrence, the risk of being unable to identify the original bleeder.  We have discussed the time to resolution and that appearance of the left labia may not ever return to normal.  All of her questions were answered.   Elonda Husky, M.D. 03/22/2023 11:45 AM

## 2023-03-22 NOTE — Progress Notes (Signed)
Patient presents today due to a vulvar hematoma. She states severe pain and discomfort along with bleeding.

## 2023-03-22 NOTE — H&P (View-Only) (Signed)
PRE-OPERATIVE HISTORY AND PHYSICAL EXAM  PCP:  Patient, No Pcp Per Subjective:   HPI:  Susan Santos is a 22 y.o. [id number].  Patient's last menstrual period was 02/28/2023.  She presents today for a pre-op discussion and PE.  She has the following symptoms: Large left labial hematoma  Review of Systems:   Constitutional: Denied constitutional symptoms, night sweats, recent illness, fatigue, fever, insomnia and weight loss.  Eyes: Denied eye symptoms, eye pain, photophobia, vision change and visual disturbance.  Ears/Nose/Throat/Neck: Denied ear, nose, throat or neck symptoms, hearing loss, nasal discharge, sinus congestion and sore throat.  Cardiovascular: Denied cardiovascular symptoms, arrhythmia, chest pain/pressure, edema, exercise intolerance, orthopnea and palpitations.  Respiratory: Denied pulmonary symptoms, asthma, pleuritic pain, productive sputum, cough, dyspnea and wheezing.  Gastrointestinal: Denied, gastro-esophageal reflux, melena, nausea and vomiting.  Genitourinary: See HPI for additional information.  Musculoskeletal: Denied musculoskeletal symptoms, stiffness, swelling, muscle weakness and myalgia.  Dermatologic: Denied dermatology symptoms, rash and scar.  Neurologic: Denied neurology symptoms, dizziness, headache, neck pain and syncope.  Psychiatric: Denied psychiatric symptoms, anxiety and depression.  Endocrine: Denied endocrine symptoms including hot flashes and night sweats.   OB History  Gravida Para Term Preterm AB Living  0 1 2  SAB IAB Ectopic Multiple Live Births  0 0 0 0 2    # Outcome Date GA Lbr Len/2nd Weight Sex Delivery Anes PTL Lv  3 Term 06/07/21 [redacted]w[redacted]d 02:06 / 00:18 7 lb 15.7 oz (3.62 kg) M Vag-Spont None  LIV  2 AB 2020          1 Term 07/25/17 [redacted]w[redacted]d 14:48 / 04:40 8 lb 6.4 oz (3.81 kg) F Vag-Vacuum None  LIV    Past Medical History:  Diagnosis Date   Gallstones    Postpartum anemia 07/26/2017   Vaginal delivery 2018   girl     Past Surgical History:  Procedure Laterality Date   CHOLECYSTECTOMY N/A 06/11/2019   Procedure: LAPAROSCOPIC CHOLECYSTECTOMY;  Surgeon: Henrene Dodge, MD;  Location: [facility] ORS;  Service: General;  Laterality: N/A;   NO PAST SURGERIES        SOCIAL HISTORY:  Social History   Tobacco Use  Smoking Status Never  Smokeless Tobacco Never   Social History   Substance and Sexual Activity  Alcohol Use Yes   Comment: occasional    Social History   Substance and Sexual Activity  Drug Use No    Family History  Problem Relation Age of Onset   Diabetes Mother    Diabetes Maternal Uncle    Diabetes Maternal Grandmother    Breast cancer Neg Hx    Hypertension Neg Hx     ALLERGIES:  Patient has no known allergies.  MEDS:   Current Outpatient Medications on File Prior to Visit  Medication Sig Dispense Refill   ibuprofen (ADVIL) 600 MG tablet Take 1 tablet (600 mg total) by mouth every 6 (six) hours. 30 tablet 0   naproxen (NAPROSYN) 250 MG tablet Take 1 tablet (250 mg total) by mouth 2 (two) times daily with a meal for 10 days. 20 tablet 0   docusate sodium (COLACE) 100 MG capsule Take 1 capsule (100 mg total) by mouth 2 (two) times daily. (Patient not taking: Reported on 03/22/2023) 10 capsule 0   ferrous sulfate 325 (65 FE) MG tablet Take 1 tablet (325 mg total) by mouth daily. 30 tablet 3   medroxyPROGESTERone (DEPO-PROVERA) 150 MG/ML injection Inject 1 mL (150  mg total) into the muscle every 3 (three) months. (Patient not taking: Reported on 03/22/2023) 1 mL 3   ondansetron (ZOFRAN-ODT) 4 MG disintegrating tablet Take 1 tablet (4 mg total) by mouth every 8 (eight) hours as needed for nausea or vomiting. (Patient not taking: Reported on 03/22/2023) 8 tablet 0   Prenatal Vit-Fe Fumarate-FA (MULTIVITAMIN-PRENATAL) 27-0.8 MG TABS tablet Take 1 tablet by mouth daily at 12 noon. (Patient not taking: Reported on 03/22/2023) 30 tablet 12   No current facility-administered medications on  file prior to visit.    No orders of the defined types were placed in this encounter.    Physical examination BP 122/80   Pulse (!) 112   Ht  (1.575 m)   Wt 150 lb (68 kg)   LMP 02/28/2023   BMI 27.44 kg/m   General NAD, Conversant  HEENT Atraumatic; Op clear with mmm.  Normo-cephalic. Pupils reactive. Anicteric sclerae  Thyroid/Neck Smooth without nodularity or enlargement. Normal ROM.  Neck Supple.  Skin No rashes, lesions or ulceration. Normal palpated skin turgor. No nodularity.  Breasts: No masses or discharge.  Symmetric.  No axillary adenopathy.  Lungs: Clear to auscultation.No rales or wheezes. Normal Respiratory effort, no retractions.  Heart: NSR.  No murmurs or rubs appreciated. No peripheral edema  Abdomen: Soft.  Non-tender.  No masses.  No HSM. No hernia  Extremities: Moves all appropriately.  Normal ROM for age. No lymphadenopathy.  Neuro: Oriented to PPT.  Normal mood. Normal affect.     Pelvic:   Vulva: Grossly swollen left labia consistent with large vulvar hematoma.  Now bleeding and oozing blood as a transudate  Vagina: No lesions or abnormalities noted.  Support: Normal pelvic support.  Urethra No masses tenderness or scarring.  Meatus Normal size without lesions or prolapse.  Cervix: Normal ectropion.  No lesions.  Anus: Normal exam.  No lesions.  Perineum: Normal exam.  No lesions.        Bimanual   Uterus: Normal size.  Non-tender.  Mobile.  AV.  Adnexae: No masses.  Non-tender to palpation.  Cul-de-sac: Negative for abnormality.   Assessment:   [id number] Patient Active Problem List   Diagnosis Date Noted   Symptomatic cholelithiasis 03/07/2019    1. Vulvar hematoma      Large vulvar hematoma that occurred after intercourse.  It has showed no evidence of resolution since it occurred.  Plan:   Orders: No orders of the defined types were placed in this encounter.    1.  Evacuation of vulvar hematoma -labial repair

## 2023-03-23 ENCOUNTER — Encounter: Payer: Self-pay | Admitting: Obstetrics and Gynecology

## 2023-03-23 ENCOUNTER — Ambulatory Visit
Admission: RE | Admit: 2023-03-23 | Discharge: 2023-03-23 | Disposition: A | Discharge status: Home / Self Care | Payer: Medicaid Other | Attending: Obstetrics and Gynecology | Admitting: Obstetrics and Gynecology

## 2023-03-23 ENCOUNTER — Other Ambulatory Visit: Payer: Self-pay

## 2023-03-23 ENCOUNTER — Ambulatory Visit: Payer: Medicaid Other | Admitting: Anesthesiology

## 2023-03-23 ENCOUNTER — Telehealth: Payer: Self-pay

## 2023-03-23 ENCOUNTER — Encounter
Admission: RE | Disposition: A | Discharge status: Home / Self Care | Payer: Self-pay | Source: Home / Self Care | Attending: Obstetrics and Gynecology

## 2023-03-23 DIAGNOSIS — Z793 Long term (current) use of hormonal contraceptives: Secondary | ICD-10-CM | POA: Insufficient documentation

## 2023-03-23 DIAGNOSIS — N9089 Other specified noninflammatory disorders of vulva and perineum: Secondary | ICD-10-CM | POA: Insufficient documentation

## 2023-03-23 DIAGNOSIS — Z01818 Encounter for other preprocedural examination: Secondary | ICD-10-CM

## 2023-03-23 HISTORY — PX: HEMATOMA EVACUATION: SHX5118

## 2023-03-23 LAB — TYPE AND SCREEN
ABO/RH(D): O POS
Antibody Screen: NEGATIVE

## 2023-03-23 LAB — POCT PREGNANCY, URINE: Preg Test, Ur: NEGATIVE

## 2023-03-23 SURGERY — EVACUATION HEMATOMA
Anesthesia: General | Site: Perineum

## 2023-03-23 MED ORDER — CHLORHEXIDINE GLUCONATE 0.12 % MT SOLN
OROMUCOSAL | Status: AC
  Filled 2023-03-23: qty 15

## 2023-03-23 MED ORDER — FENTANYL CITRATE (PF) 100 MCG/2ML IJ SOLN
INTRAMUSCULAR | Status: DC | PRN
  Administered 2023-03-23 (×2): 50 ug via INTRAVENOUS
  Administered 2023-03-23: 25 ug via INTRAVENOUS
  Administered 2023-03-23: 50 ug via INTRAVENOUS
  Administered 2023-03-23: 25 ug via INTRAVENOUS

## 2023-03-23 MED ORDER — ACETAMINOPHEN 10 MG/ML IV SOLN
INTRAVENOUS | Status: DC | PRN
  Administered 2023-03-23: 1000 mg via INTRAVENOUS

## 2023-03-23 MED ORDER — DEXMEDETOMIDINE HCL IN NACL 80 MCG/20ML IV SOLN
INTRAVENOUS | Status: DC | PRN
  Administered 2023-03-23 (×2): 8 ug via BUCCAL

## 2023-03-23 MED ORDER — FAMOTIDINE 20 MG PO TABS
ORAL_TABLET | ORAL | Status: AC
  Filled 2023-03-23: qty 1

## 2023-03-23 MED ORDER — FENTANYL CITRATE (PF) 100 MCG/2ML IJ SOLN
25.0000 ug | INTRAMUSCULAR | Status: DC | PRN
  Administered 2023-03-23 (×4): 25 ug via INTRAVENOUS

## 2023-03-23 MED ORDER — EPHEDRINE 5 MG/ML INJ
INTRAVENOUS | Status: AC
  Filled 2023-03-23: qty 5

## 2023-03-23 MED ORDER — LIDOCAINE HCL (CARDIAC) PF 100 MG/5ML IV SOSY
PREFILLED_SYRINGE | INTRAVENOUS | Status: DC | PRN
  Administered 2023-03-23: 50 mg via INTRAVENOUS

## 2023-03-23 MED ORDER — LACTATED RINGERS IV SOLN: INTRAVENOUS | Status: DC

## 2023-03-23 MED ORDER — MIDAZOLAM HCL 2 MG/2ML IJ SOLN
INTRAMUSCULAR | Status: AC
  Filled 2023-03-23: qty 2

## 2023-03-23 MED ORDER — FENTANYL CITRATE (PF) 100 MCG/2ML IJ SOLN
INTRAMUSCULAR | Status: AC
  Filled 2023-03-23: qty 2

## 2023-03-23 MED ORDER — OXYCODONE HCL 5 MG/5ML PO SOLN: 5.0000 mg | Freq: Once | ORAL | Status: AC | PRN

## 2023-03-23 MED ORDER — DEXAMETHASONE SODIUM PHOSPHATE 10 MG/ML IJ SOLN
INTRAMUSCULAR | Status: DC | PRN
  Administered 2023-03-23: 10 mg via INTRAVENOUS

## 2023-03-23 MED ORDER — ORAL CARE MOUTH RINSE: 15.0000 mL | Freq: Once | OROMUCOSAL | Status: AC

## 2023-03-23 MED ORDER — ONDANSETRON HCL 4 MG/2ML IJ SOLN
INTRAMUSCULAR | Status: AC
  Filled 2023-03-23: qty 2

## 2023-03-23 MED ORDER — KETOROLAC TROMETHAMINE 30 MG/ML IJ SOLN
INTRAMUSCULAR | Status: AC
  Filled 2023-03-23: qty 1

## 2023-03-23 MED ORDER — MIDAZOLAM HCL 2 MG/2ML IJ SOLN
INTRAMUSCULAR | Status: DC | PRN
  Administered 2023-03-23: 2 mg via INTRAVENOUS

## 2023-03-23 MED ORDER — ACETAMINOPHEN 10 MG/ML IV SOLN
INTRAVENOUS | Status: AC
  Filled 2023-03-23: qty 100

## 2023-03-23 MED ORDER — POVIDONE-IODINE 10 % EX SWAB
2.0000 | Freq: Once | CUTANEOUS | Status: AC
  Administered 2023-03-23: 2 via TOPICAL

## 2023-03-23 MED ORDER — KETOROLAC TROMETHAMINE 30 MG/ML IJ SOLN
INTRAMUSCULAR | Status: DC | PRN
  Administered 2023-03-23: 30 mg via INTRAVENOUS

## 2023-03-23 MED ORDER — EPHEDRINE SULFATE (PRESSORS) 50 MG/ML IJ SOLN
INTRAMUSCULAR | Status: DC | PRN
  Administered 2023-03-23: 10 mg via INTRAVENOUS

## 2023-03-23 MED ORDER — 0.9 % SODIUM CHLORIDE (POUR BTL) OPTIME
TOPICAL | Status: DC | PRN
  Administered 2023-03-23: 400 mL

## 2023-03-23 MED ORDER — HYDROCODONE-ACETAMINOPHEN 5-325 MG PO TABS
1.0000 | ORAL_TABLET | Freq: Four times a day (QID) | ORAL | 0 refills | Status: DC | PRN
Start: 2023-03-23 — End: 2023-04-01

## 2023-03-23 MED ORDER — LIDOCAINE-EPINEPHRINE 1 %-1:100000 IJ SOLN
INTRAMUSCULAR | Status: AC
  Filled 2023-03-23: qty 1

## 2023-03-23 MED ORDER — LIDOCAINE HCL (PF) 2 % IJ SOLN
INTRAMUSCULAR | Status: AC
  Filled 2023-03-23: qty 5

## 2023-03-23 MED ORDER — ONDANSETRON HCL 4 MG/2ML IJ SOLN
INTRAMUSCULAR | Status: DC | PRN
  Administered 2023-03-23: 4 mg via INTRAVENOUS

## 2023-03-23 MED ORDER — PROPOFOL 10 MG/ML IV BOLUS
INTRAVENOUS | Status: DC | PRN
  Administered 2023-03-23: 130 mg via INTRAVENOUS

## 2023-03-23 MED ORDER — CHLORHEXIDINE GLUCONATE 0.12 % MT SOLN
15.0000 mL | Freq: Once | OROMUCOSAL | Status: AC
  Administered 2023-03-23: 15 mL via OROMUCOSAL

## 2023-03-23 MED ORDER — OXYCODONE HCL 5 MG PO TABS
5.0000 mg | ORAL_TABLET | Freq: Once | ORAL | Status: AC | PRN
  Administered 2023-03-23: 5 mg via ORAL

## 2023-03-23 MED ORDER — FAMOTIDINE 20 MG PO TABS
20.0000 mg | ORAL_TABLET | Freq: Once | ORAL | Status: AC
  Administered 2023-03-23: 20 mg via ORAL

## 2023-03-23 MED ORDER — DEXAMETHASONE SODIUM PHOSPHATE 10 MG/ML IJ SOLN
INTRAMUSCULAR | Status: AC
  Filled 2023-03-23: qty 1

## 2023-03-23 MED ORDER — OXYCODONE HCL 5 MG PO TABS
ORAL_TABLET | ORAL | Status: AC
  Filled 2023-03-23: qty 1

## 2023-03-23 SURGICAL SUPPLY — 18 items
CUP MEDICINE 2OZ PLAST GRAD ST (MISCELLANEOUS) ×1 IMPLANT
DRSG TELFA 3X8 NADH STRL (GAUZE/BANDAGES/DRESSINGS) ×1 IMPLANT
GLOVE PI ORTHO PRO STRL 7.5 (GLOVE) ×1 IMPLANT
GOWN STRL REUS W/ TWL LRG LVL3 (GOWN DISPOSABLE) ×2 IMPLANT
GOWN STRL REUS W/TWL LRG LVL3 (GOWN DISPOSABLE) ×2
HANDLE YANKAUER SUCT BULB TIP (MISCELLANEOUS) IMPLANT
KIT TURNOVER KIT A (KITS) ×1 IMPLANT
MANIFOLD NEPTUNE II (INSTRUMENTS) ×1 IMPLANT
NDL HYPO 25X1 1.5 SAFETY (NEEDLE) ×1 IMPLANT
NEEDLE HYPO 25X1 1.5 SAFETY (NEEDLE) ×1 IMPLANT
PACK DNC HYST (MISCELLANEOUS) ×1 IMPLANT
PAD OB MATERNITY 4.3X12.25 (PERSONAL CARE ITEMS) ×1 IMPLANT
PAD PREP 24X41 OB/GYN DISP (PERSONAL CARE ITEMS) ×1 IMPLANT
PENCIL SMOKE EVACUATOR (MISCELLANEOUS) IMPLANT
SCRUB CHG 4% DYNA-HEX 4OZ (MISCELLANEOUS) ×1 IMPLANT
SUT VIC AB 3-0 PS2 18 (SUTURE) IMPLANT
SUT VICRYL 3-0 27IN (SUTURE) IMPLANT
TRAP FLUID SMOKE EVACUATOR (MISCELLANEOUS) ×1 IMPLANT

## 2023-03-23 NOTE — Anesthesia Postprocedure Evaluation (Signed)
Anesthesia Post Note  Patient: Susan Santos  Procedure(s) Performed: EVACUATION OF VULVAR HEMATOMA-REPAIR OF LABIA (Perineum)  Patient location during evaluation: PACU Anesthesia Type: General Level of consciousness: awake and alert Pain management: pain level controlled Vital Signs Assessment: post-procedure vital signs reviewed and stable Respiratory status: spontaneous breathing, nonlabored ventilation and respiratory function stable Cardiovascular status: blood pressure returned to baseline and stable Postop Assessment: no apparent nausea or vomiting Anesthetic complications: no   No notable events documented.   Last Vitals:  Vitals:   03/23/23 1245 03/23/23 1304  BP: 108/66 108/88  Pulse: (!) 58 61  Resp: 12 12  Temp:  36.4 C  SpO2: 97% 99%    Last Pain:  Vitals:   03/23/23 1304  TempSrc: Temporal  PainSc: 4                  Foye Deer

## 2023-03-23 NOTE — Telephone Encounter (Signed)
Pt had surgery this morning, she wants to know if a work note can be written for her to be off until she sees him (4/26)? She also needs work note for her mom excusing her from work today and tomorrow so she can be with pt. Dr Logan Bores told pt's mom he would write her a note too. Pt would like both work notes printed for pick up.

## 2023-03-23 NOTE — Transfer of Care (Signed)
Immediate Anesthesia Transfer of Care Note  Patient: Susan Santos  Procedure(s) Performed: EVACUATION OF VULVAR HEMATOMA-REPAIR OF LABIA (Perineum)  Patient Location: PACU  Anesthesia Type:General  Level of Consciousness: drowsy and patient cooperative  Airway & Oxygen Therapy: Patient Spontanous Breathing and Patient connected to nasal cannula oxygen  Post-op Assessment: Report given to RN and Post -op Vital signs reviewed and stable  Post vital signs: Reviewed and stable  Last Vitals:  Vitals Value Taken Time  BP 106/62 03/23/23 1136  Temp 36.1 C 03/23/23 1136  Pulse 69 03/23/23 1145  Resp 12 03/23/23 1145  SpO2 100 % 03/23/23 1145  Vitals shown include unvalidated device data.  Last Pain:  Vitals:   03/23/23 0900  TempSrc: Oral  PainSc: 7       Patients Stated Pain Goal: 0 (03/23/23 0900)  Complications: No notable events documented.

## 2023-03-23 NOTE — Interval H&P Note (Signed)
History and Physical Interval Note:  03/23/2023 9:22 AM  Susan Santos  has presented today for surgery, with the diagnosis of Labial hematoma.  The various methods of treatment have been discussed with the patient and family. After consideration of risks, benefits and other options for treatment, the patient has consented to  Procedure(s): EVACUATION OF VULVAR HEMATOMA-REPAIR OF LABIA (N/A) as a surgical intervention.  The patient's history has been reviewed, patient examined, no change in status, stable for surgery.  I have reviewed the patient's chart and labs.  Questions were answered to the patient's satisfaction.     Brennan Bailey

## 2023-03-23 NOTE — Telephone Encounter (Signed)
Sent via MyChart

## 2023-03-23 NOTE — Anesthesia Preprocedure Evaluation (Signed)
Anesthesia Evaluation  Patient identified by MRN, date of birth, ID band Patient awake    Reviewed: Allergy & Precautions, NPO status , Patient's Chart, lab work & pertinent test results  History of Anesthesia Complications Negative for: history of anesthetic complications  Airway Mallampati: III  TM Distance: >3 FB Neck ROM: full    Dental  (+) Chipped   Pulmonary neg pulmonary ROS, neg shortness of breath   Pulmonary exam normal        Cardiovascular Exercise Tolerance: Good (-) angina (-) Past MI and (-) DOE negative cardio ROS Normal cardiovascular exam     Neuro/Psych negative neurological ROS  negative psych ROS   GI/Hepatic negative GI ROS, Neg liver ROS,neg GERD  ,,  Endo/Other  negative endocrine ROS    Renal/GU      Musculoskeletal   Abdominal   Peds  Hematology negative hematology ROS (+)   Anesthesia Other Findings Past Medical History: No date: Gallstones 07/26/2017: Postpartum anemia 2018: Vaginal delivery     Comment:  girl 2022: Vaginal delivery     Comment:  boy  Past Surgical History: 06/11/2019: CHOLECYSTECTOMY; N/A     Comment:  Procedure: LAPAROSCOPIC CHOLECYSTECTOMY;  Surgeon:               Henrene Dodge, MD;  Location: [facility] ORS;  Service:               General;  Laterality: N/A;  BMI    Body Mass Index: 27.42 kg/m      Reproductive/Obstetrics negative OB ROS                             Anesthesia Physical Anesthesia Plan  ASA: 2  Anesthesia Plan: General LMA   Post-op Pain Management:    Induction: Intravenous  PONV Risk Score and Plan: Dexamethasone, Ondansetron, Midazolam and Treatment may vary due to age or medical condition  Airway Management Planned: LMA  Additional Equipment:   Intra-op Plan:   Post-operative Plan: Extubation in OR  Informed Consent: I have reviewed the patients History and Physical, chart, labs and discussed the  procedure including the risks, benefits and alternatives for the proposed anesthesia with the patient or authorized representative who has indicated his/her understanding and acceptance.     Dental Advisory Given  Plan Discussed with: Anesthesiologist, CRNA and Surgeon  Anesthesia Plan Comments: (Patient consented for risks of anesthesia including but not limited to:  - adverse reactions to medications - damage to eyes, teeth, lips or other oral mucosa - nerve damage due to positioning  - sore throat or hoarseness - Damage to heart, brain, nerves, lungs, other parts of body or loss of life  Patient voiced understanding.)       Anesthesia Quick Evaluation

## 2023-03-23 NOTE — Discharge Instructions (Signed)

## 2023-03-23 NOTE — OR Nursing (Signed)
Intermittent urinary catheter performed by Dr Logan Bores

## 2023-03-23 NOTE — Progress Notes (Signed)
Per pt, she has naproxyn and oxycodone at home for pain.

## 2023-03-23 NOTE — Anesthesia Procedure Notes (Signed)
Procedure Name: LMA Insertion Date/Time: 03/23/2023 10:26 AM  Performed by: Omer Jack, CRNAPre-anesthesia Checklist: Patient identified, Patient being monitored, Timeout performed, Emergency Drugs available and Suction available Patient Re-evaluated:Patient Re-evaluated prior to induction Oxygen Delivery Method: Circle system utilized Preoxygenation: Pre-oxygenation with 100% oxygen Induction Type: IV induction Ventilation: Mask ventilation without difficulty LMA: LMA inserted LMA Size: 3.0 Tube type: Oral Number of attempts: 1 Placement Confirmation: positive ETCO2 and breath sounds checked- equal and bilateral Tube secured with: Tape Dental Injury: Teeth and Oropharynx as per pre-operative assessment

## 2023-03-24 ENCOUNTER — Encounter: Payer: Self-pay | Admitting: Obstetrics and Gynecology

## 2023-03-24 NOTE — Op Note (Signed)
   OPERATIVE NOTE 03/24/2023 8:06 AM  PRE-OPERATIVE DIAGNOSIS:  1) Labial hematoma  POST-OPERATIVE DIAGNOSIS:  1) same  OPERATION: Evacuation of labial hematoma and repair of labia SURGEON(S): Surgeon(s) and Role:    Linzie Collin, MD - Primary   ANESTHESIA: General  ESTIMATED BLOOD LOSS: 25 mL  OPERATIVE FINDINGS: Large left labial hematoma  SPECIMEN: * No specimens in log *  COMPLICATIONS: None  DRAINS: None  DISPOSITION: Stable to recovery room  DESCRIPTION OF PROCEDURE:      The patient was prepped and draped in the dorsal lithotomy position and placed under general anesthesia.  The large left labial hematoma was identified.  A small incision was made in the medial aspect of the hematoma at the very thin portion which had become quite attenuated.  Using blunt and sharp dissection this area was extended to incorporate another small area that it already opened and was draining.  The entire inner aspect of the labia was attenuated.  Large clots were removed from the hematoma.  The entire area of hematoma was irrigated and cleaned.  The edges of the attenuated labia were systematically removed until good tissue was obtained.  Hemostasis was noted and along the edges the Bovie was used to ensure hemostasis.  Deep within the cavity of the hematoma pursestring sutures were placed approximating all the lower layers carefully and closing all dead space.  This was repeatedly performed to close the dead space in the labia.  A subcuticular suture was then used to approximate the now viable tissue of the external labia.  Hemostasis was again noted.  Patient went to recovery room in stable condition and will be discharged later today.   Linzie Collin, MD [address] [place] [address] [phone]  Follow up on 04/01/2023 :Arne Cleveland, MD [address] [place] [address] [phone]  Follow up in 1 week(s)    Elonda Husky,  M.D. 03/24/2023 8:06 AM

## 2023-03-30 ENCOUNTER — Telehealth: Payer: Self-pay

## 2023-03-30 NOTE — Telephone Encounter (Signed)
Pt calling to see if anything can be rx'd; she hasn't been able to eat because her stomach hurts.  [phone]

## 2023-03-30 NOTE — Telephone Encounter (Signed)
Speaking with patient via Mychart.

## 2023-04-01 ENCOUNTER — Ambulatory Visit (INDEPENDENT_AMBULATORY_CARE_PROVIDER_SITE_OTHER): Payer: Medicaid Other | Admitting: Obstetrics and Gynecology

## 2023-04-01 ENCOUNTER — Encounter: Payer: Self-pay | Admitting: Obstetrics and Gynecology

## 2023-04-01 VITALS — BP 106/70 | HR 76 | Ht 62.0 in | Wt 150.4 lb

## 2023-04-01 DIAGNOSIS — Z9889 Other specified postprocedural states: Secondary | ICD-10-CM

## 2023-04-01 NOTE — Progress Notes (Signed)
HPI:      Susan Santos is a 22 y.o. [id number] who LMP was Patient's last menstrual period was 02/28/2023.  Subjective:   She presents today postop from large labial hematoma and repair.  She reports she is doing well.  Not having any pain.  Has no issues at this time.  She did have some constipation from the pain medication but this seems to have resolved.    Hx: The following portions of the patient's history were reviewed and updated as appropriate:             She  has a past medical history of Gallstones, Postpartum anemia (07/26/2017), Vaginal delivery (2018), and Vaginal delivery (2022). She does not have any pertinent problems on file. She  has a past surgical history that includes Cholecystectomy (N/A, 06/11/2019) and Hematoma evacuation (N/A, 03/23/2023). Her family history includes Diabetes in her maternal grandmother, maternal uncle, and mother. She  reports that she has never smoked. She has never used smokeless tobacco. She reports current alcohol use. She reports that she does not use drugs. She currently has no medications in their medication list. She has No Known Allergies.       Review of Systems:  Review of Systems  Constitutional: Denied constitutional symptoms, night sweats, recent illness, fatigue, fever, insomnia and weight loss.  Eyes: Denied eye symptoms, eye pain, photophobia, vision change and visual disturbance.  Ears/Nose/Throat/Neck: Denied ear, nose, throat or neck symptoms, hearing loss, nasal discharge, sinus congestion and sore throat.  Cardiovascular: Denied cardiovascular symptoms, arrhythmia, chest pain/pressure, edema, exercise intolerance, orthopnea and palpitations.  Respiratory: Denied pulmonary symptoms, asthma, pleuritic pain, productive sputum, cough, dyspnea and wheezing.  Gastrointestinal: Denied, gastro-esophageal reflux, melena, nausea and vomiting.  Genitourinary: Denied genitourinary symptoms including symptomatic vaginal discharge,  pelvic relaxation issues, and urinary complaints.  Musculoskeletal: Denied musculoskeletal symptoms, stiffness, swelling, muscle weakness and myalgia.  Dermatologic: Denied dermatology symptoms, rash and scar.  Neurologic: Denied neurology symptoms, dizziness, headache, neck pain and syncope.  Psychiatric: Denied psychiatric symptoms, anxiety and depression.  Endocrine: Denied endocrine symptoms including hot flashes and night sweats.   Meds:   No current outpatient medications on file prior to visit.   No current facility-administered medications on file prior to visit.      Objective:     Vitals:   04/01/23 1208  BP: 106/70  Pulse: 76   Filed Weights   04/01/23 1208  Weight: 150 lb 6.4 oz (68.2 kg)              Physical examination   Pelvic:   Vulva: Normal appearance.  No lesions.  Looks completely normal.  Suture line examined.  Vagina: No lesions or abnormalities noted.  Support: Normal pelvic support.  Urethra No masses tenderness or scarring.  Meatus Normal size without lesions or prolapse.  Cervix: Normal appearance.  No lesions.  Anus: Normal exam.  No lesions.  Perineum: Normal exam.  No lesions.        Bimanual   Adnexae: No masses.  Non-tender to palpation.  Cul-de-sac: Negative for abnormality.             Assessment:    [id number] Patient Active Problem List   Diagnosis Date Noted   Symptomatic cholelithiasis 03/07/2019     1. Postoperative state     Excellent recovery.  Patient doing very well.     Plan:            1.  Patient may resume normal activities  with exception of intercourse.  I have asked her to refrain from intercourse for about 3 more weeks.  Continue keeping the area clean sitz bath's as needed.  (MyChart media photos taken at patient permission) Orders No orders of the defined types were placed in this encounter.   No orders of the defined types were placed in this encounter.     F/U  No follow-ups on file.  Elonda Husky, M.D. 04/01/2023 12:40 PM

## 2023-04-01 NOTE — Progress Notes (Signed)
Patient presents for 1 week postop follow-up following labial repair following a hematoma. She states no longer having any pain or bleeding.

## 2023-05-06 ENCOUNTER — Emergency Department: Payer: Medicaid Other

## 2023-05-06 ENCOUNTER — Other Ambulatory Visit: Payer: Self-pay

## 2023-05-06 ENCOUNTER — Emergency Department
Admission: EM | Admit: 2023-05-06 | Discharge: 2023-05-06 | Disposition: A | Discharge status: Home / Self Care | Payer: Medicaid Other | Attending: Emergency Medicine | Admitting: Emergency Medicine

## 2023-05-06 DIAGNOSIS — N390 Urinary tract infection, site not specified: Secondary | ICD-10-CM

## 2023-05-06 DIAGNOSIS — R109 Unspecified abdominal pain: Secondary | ICD-10-CM | POA: Diagnosis present

## 2023-05-06 DIAGNOSIS — R8289 Other abnormal findings on cytological and histological examination of urine: Secondary | ICD-10-CM | POA: Diagnosis not present

## 2023-05-06 DIAGNOSIS — R1084 Generalized abdominal pain: Secondary | ICD-10-CM

## 2023-05-06 DIAGNOSIS — K5900 Constipation, unspecified: Secondary | ICD-10-CM | POA: Diagnosis not present

## 2023-05-06 LAB — CBC
HCT: 40.1 % (ref 36.0–46.0)
Hemoglobin: 13.5 g/dL (ref 12.0–15.0)
MCH: 30.3 pg (ref 26.0–34.0)
MCHC: 33.7 g/dL (ref 30.0–36.0)
MCV: 89.9 fL (ref 80.0–100.0)
Platelets: 260 10*3/uL (ref 150–400)
RBC: 4.46 MIL/uL (ref 3.87–5.11)
RDW: 11.8 % (ref 11.5–15.5)
WBC: 11.6 10*3/uL — ABNORMAL HIGH (ref 4.0–10.5)
nRBC: 0 % (ref 0.0–0.2)

## 2023-05-06 LAB — COMPREHENSIVE METABOLIC PANEL
ALT: 17 U/L (ref 0–44)
AST: 18 U/L (ref 15–41)
Albumin: 4.2 g/dL (ref 3.5–5.0)
Alkaline Phosphatase: 74 U/L (ref 38–126)
Anion gap: 6 (ref 5–15)
BUN: 12 mg/dL (ref 6–20)
CO2: 24 mmol/L (ref 22–32)
Calcium: 9 mg/dL (ref 8.9–10.3)
Chloride: 108 mmol/L (ref 98–111)
Creatinine, Ser: 0.74 mg/dL (ref 0.44–1.00)
GFR, Estimated: >60 mL/min (ref >60)
Glucose, Bld: 110 mg/dL — ABNORMAL HIGH (ref 70–99)
Potassium: 3.7 mmol/L (ref 3.5–5.1)
Sodium: 138 mmol/L (ref 135–145)
Total Bilirubin: 0.4 mg/dL (ref 0.3–1.2)
Total Protein: 7.8 g/dL (ref 6.5–8.1)

## 2023-05-06 LAB — URINALYSIS, ROUTINE W REFLEX MICROSCOPIC
Bilirubin Urine: NEGATIVE
Glucose, UA: NEGATIVE mg/dL
Ketones, ur: NEGATIVE mg/dL
Nitrite: NEGATIVE
Protein, ur: 30 mg/dL — AB
Specific Gravity, Urine: 1.011 (ref 1.005–1.030)
WBC, UA: >50 WBC/hpf (ref 0–5)
pH: 5 (ref 5.0–8.0)

## 2023-05-06 LAB — POC URINE PREG, ED: Preg Test, Ur: NEGATIVE

## 2023-05-06 LAB — LIPASE, BLOOD: Lipase: 31 U/L (ref 11–51)

## 2023-05-06 MED ORDER — KETOROLAC TROMETHAMINE 30 MG/ML IJ SOLN
30.0000 mg | Freq: Once | INTRAMUSCULAR | Status: AC
  Administered 2023-05-06: 30 mg via INTRAMUSCULAR
  Filled 2023-05-06: qty 1

## 2023-05-06 MED ORDER — CEPHALEXIN 500 MG PO CAPS: 500.0000 mg | ORAL_CAPSULE | Freq: Two times a day (BID) | ORAL | 0 refills | Status: DC

## 2023-05-06 NOTE — ED Triage Notes (Signed)
Pt presents to ED with c/o of L lateral side pain since 0400 this morning. Pt denies N/V/D. NAD noted.

## 2023-05-06 NOTE — ED Provider Notes (Signed)
[facility] Provider Note    Event Date/Time   First MD Initiated Contact with Patient 05/06/23 1015     (approximate)   History   Abdominal Pain   HPI  Susan Santos is a 22 y.o. female with a history of cholecystectomy who presents with complaints of left-sided flank pain.  Patient reports this developed at 4 AM this morning.  Pain continues today.  No history of kidney stones in the past, denies dysuria but does report frequent urination.  No fevers chills.  No vomiting.  Normal stools.     Physical Exam   Triage Vital Signs: ED Triage Vitals  Enc Vitals Group     BP 05/06/23 0949 110/76     Pulse Rate 05/06/23 0946 83     Resp 05/06/23 0946 17     Temp 05/06/23 0946 98.9 F (37.2 C)     Temp Source 05/06/23 0946 Oral     SpO2 05/06/23 0946 98 %     Weight 05/06/23 1029 68.2 kg (150 lb 5.7 oz)     Height 05/06/23 1029 1.575 m (5\' 2" )     Head Circumference --      Peak Flow --      Pain Score 05/06/23 0947 8     Pain Loc --      Pain Edu? --      Excl. in GC? --     Most recent vital signs: Vitals:   05/06/23 0946 05/06/23 0949  BP:  110/76  Pulse: 83   Resp: 17   Temp: 98.9 F (37.2 C)   SpO2: 98%      General: Awake, no distress.  CV:  Good peripheral perfusion.  Resp:  Normal effort.  Abd:  No distention.  Overall soft, nontender, reassuring exam, no CVA tenderness Other:     ED Results / Procedures / Treatments   Labs (all labs ordered are listed, but only abnormal results are displayed) Labs Reviewed  COMPREHENSIVE METABOLIC PANEL - Abnormal; Notable for the following components:      Result Value   Glucose, Bld 110 (*)    All other components within normal limits  CBC - Abnormal; Notable for the following components:   WBC 11.6 (*)    All other components within normal limits  URINALYSIS, ROUTINE W REFLEX MICROSCOPIC - Abnormal; Notable for the following components:   Color, Urine STRAW (*)    APPearance  HAZY (*)    Hgb urine dipstick MODERATE (*)    Protein, ur 30 (*)    Leukocytes,Ua MODERATE (*)    Bacteria, UA RARE (*)    All other components within normal limits  LIPASE, BLOOD  POC URINE PREG, ED     EKG     RADIOLOGY CT renal stone study viewed interpret by me, no evidence of ureterolithiasis    PROCEDURES:  Critical Care performed:   Procedures   MEDICATIONS ORDERED IN ED: Medications  ketorolac (TORADOL) 30 MG/ML injection 30 mg (30 mg Intramuscular Given 05/06/23 1059)     IMPRESSION / MDM / ASSESSMENT AND PLAN / ED COURSE  I reviewed the triage vital signs and the nursing notes. Patient's presentation is most consistent with acute presentation with potential threat to life or bodily function.  Patient presents with left-sided flank pain, differential includes UTI, pyelonephritis, ureterolithiasis, less likely diverticulitis.  Lab work demonstrates mildly elevated white blood cell count which is nonspecific otherwise labs are reassuring, patient has negative pregnancy  test.  UA demonstrates rare bacteria, some red blood cells, will send for CT renal stone study to evaluate for ureterolithiasis, IM Toradol given.  CT scan is negative for ureterolithiasis, patient is feeling improved, will treat for UTI, recommended increase fiber intake for constipation seen on CT scan, outpatient follow-up as needed, return precautions discussed.      FINAL CLINICAL IMPRESSION(S) / ED DIAGNOSES   Final diagnoses:  Generalized abdominal pain  Lower urinary tract infectious disease  Constipation, unspecified constipation type     Rx / DC Orders   ED Discharge Orders          Ordered    cephALEXin (KEFLEX) 500 MG capsule  2 times daily        05/06/23 1240             Note:  This document was prepared using Dragon voice recognition software and may include unintentional dictation errors.   Jene Every, MD 05/06/23 [phone]

## 2024-02-27 ENCOUNTER — Emergency Department

## 2024-02-27 ENCOUNTER — Emergency Department
Admission: EM | Admit: 2024-02-27 | Discharge: 2024-02-27 | Disposition: A | Discharge status: Home / Self Care | Attending: Emergency Medicine | Admitting: Emergency Medicine

## 2024-02-27 ENCOUNTER — Telehealth: Payer: Self-pay

## 2024-02-27 DIAGNOSIS — O26891 Other specified pregnancy related conditions, first trimester: Secondary | ICD-10-CM | POA: Insufficient documentation

## 2024-02-27 DIAGNOSIS — Z3A00 Weeks of gestation of pregnancy not specified: Secondary | ICD-10-CM | POA: Insufficient documentation

## 2024-02-27 DIAGNOSIS — R1030 Lower abdominal pain, unspecified: Secondary | ICD-10-CM | POA: Diagnosis not present

## 2024-02-27 DIAGNOSIS — O26899 Other specified pregnancy related conditions, unspecified trimester: Secondary | ICD-10-CM

## 2024-02-27 LAB — URINALYSIS, ROUTINE W REFLEX MICROSCOPIC
Bilirubin Urine: NEGATIVE
Glucose, UA: NEGATIVE mg/dL
Hgb urine dipstick: NEGATIVE
Ketones, ur: NEGATIVE mg/dL
Nitrite: NEGATIVE
Protein, ur: NEGATIVE mg/dL
Specific Gravity, Urine: 1.023 (ref 1.005–1.030)
pH: 5 (ref 5.0–8.0)

## 2024-02-27 LAB — CBC
HCT: 39.2 % (ref 36.0–46.0)
Hemoglobin: 13.2 g/dL (ref 12.0–15.0)
MCH: 30.3 pg (ref 26.0–34.0)
MCHC: 33.7 g/dL (ref 30.0–36.0)
MCV: 90.1 fL (ref 80.0–100.0)
Platelets: 266 10*3/uL (ref 150–400)
RBC: 4.35 MIL/uL (ref 3.87–5.11)
RDW: 12.1 % (ref 11.5–15.5)
WBC: 8.2 10*3/uL (ref 4.0–10.5)
nRBC: 0 % (ref 0.0–0.2)

## 2024-02-27 LAB — BASIC METABOLIC PANEL
Anion gap: 7 (ref 5–15)
BUN: 13 mg/dL (ref 6–20)
CO2: 23 mmol/L (ref 22–32)
Calcium: 9.1 mg/dL (ref 8.9–10.3)
Chloride: 105 mmol/L (ref 98–111)
Creatinine, Ser: 0.51 mg/dL (ref 0.44–1.00)
GFR, Estimated: >60 mL/min (ref >60)
Glucose, Bld: 104 mg/dL — ABNORMAL HIGH (ref 70–99)
Potassium: 3.6 mmol/L (ref 3.5–5.1)
Sodium: 135 mmol/L (ref 135–145)

## 2024-02-27 LAB — LIPASE, BLOOD: Lipase: 25 U/L (ref 11–51)

## 2024-02-27 LAB — HCG, QUANTITATIVE, PREGNANCY: hCG, Beta Chain, Quant, S: 924 m[IU]/mL — ABNORMAL HIGH (ref <5)

## 2024-02-27 NOTE — ED Triage Notes (Signed)
 Pt presents to the ED POV from home. Pt was sent here by OBGYN. Pt is approximately [redacted] weeks pregnant. Pt reports cramping right side as well as lower abdomen that started this weekend. No bleeding present. No N/V/D.  First day of LMP: 01/08/24

## 2024-02-27 NOTE — Discharge Instructions (Signed)
 Results of the Ultrasound:   Probable early intrauterine gestational sac, but no yolk sac, fetal  pole, or radiologist recommended recommend follow-up  quantitative B-HCG levels and follow-up US in 14 days to assess  viability.    Please schedule a follow up appointment with your OB in 2 days for a repeat beta HCG.

## 2024-02-27 NOTE — ED Provider Notes (Signed)
 [facility] Provider Note    Event Date/Time   First MD Initiated Contact with Patient 02/27/24 [phone]     (approximate)   History   Abdominal Cramping   HPI  Susan Santos is a 23 y.o. female  [id number] who presents for evaluation of abdominal cramping. Patient is about [redacted] weeks pregnant by LMP on 01/08/24. She states the cramping began Saturday night and has gotten progressively worse. She states she gets a sharp pain across the lower abdomen in the right lower ribs. No vaginal  bleeding, N/V/D. No history of ectopic pregnancies or miscarriages.   Patient of [facility].       Physical Exam   Triage Vital Signs: ED Triage Vitals  Encounter Vitals Group     BP 02/27/24 1313 119/69     Systolic BP Percentile --      Diastolic BP Percentile --      Pulse Rate 02/27/24 1313 68     Resp 02/27/24 1313 17     Temp 02/27/24 1313 98.6 F (37 C)     Temp Source 02/27/24 1313 Oral     SpO2 02/27/24 1313 100 %     Weight 02/27/24 1314 157 lb (71.2 kg)     Height 02/27/24 1314 5\' 2"  (1.575 m)     Head Circumference --      Peak Flow --      Pain Score 02/27/24 1318 5     Pain Loc --      Pain Education --      Exclude from Growth Chart --     Most recent vital signs: Vitals:   02/27/24 1742 02/27/24 2200  BP:  105/76  Pulse:  72  Resp:  20  Temp: 97.8 F (36.6 C) 98.3 F (36.8 C)  SpO2:  100%   General: Awake, no distress.  CV:  Good peripheral perfusion. RRR. Resp:  Normal effort. CTAB. Abd:  No distention. Soft, TTP across lower abdomen.  Other:    ED Results / Procedures / Treatments   Labs (all labs ordered are listed, but only abnormal results are displayed) Labs Reviewed  HCG, QUANTITATIVE, PREGNANCY - Abnormal; Notable for the following components:      Result Value   hCG, Beta Chain, Quant, S 924 (*)    All other components within normal limits  BASIC METABOLIC PANEL - Abnormal; Notable for the following components:   Glucose,  Bld 104 (*)    All other components within normal limits  URINALYSIS, ROUTINE W REFLEX MICROSCOPIC - Abnormal; Notable for the following components:   Color, Urine YELLOW (*)    APPearance HAZY (*)    Leukocytes,Ua SMALL (*)    Bacteria, UA RARE (*)    All other components within normal limits  CBC  LIPASE, BLOOD    RADIOLOGY  OB ultrasound obtained, interpreted the images as well as reviewed the radiologist report which showed probable early intrauterine gestational sac but no yolk sac, fetal pole or cardiac activity yet visualized.    PROCEDURES:  Critical Care performed: No  Procedures   MEDICATIONS ORDERED IN ED: Medications - No data to display   IMPRESSION / MDM / ASSESSMENT AND PLAN / ED COURSE  I reviewed the triage vital signs and the nursing notes.                             23 year old female who is approximately  [redacted] weeks pregnant presents for evaluation of lower abdominal cramping.  Vital signs are stable patient no acute distress on exam.  Nontoxic-appearing.  Differential diagnosis includes, but is not limited to, IUP, ectopic pregnancy,  Patient's presentation is most consistent with acute complicated illness / injury requiring diagnostic workup.  Beta-hCG is 924.  Lipase WNL.  BMP and CBC unremarkable.  Urinalysis shows small amount of leukocytes with rare bacteria and squamous epithelium.   OB ultrasound showed probable early intrauterine gestational sac, but no yolk sac, fetal pole, or cardiac activity visualized.  Radiologist recommended follow-up quantitative beta hCG levels and ultrasound in 14 days to assess for viability.  Discussed this with the patient.  Given these ultrasound findings and her beta-hCG, I am most suspicious that this is an early pregnancy and she is probably not [redacted] weeks pregnant. Patient had a negative pregnancy test last week and had a positive pregnancy test on 3/20, so I think this an early pregnancy rather than a pending  miscarriage. But I did explain that at 7 weeks I would expect her beta to be a little bit higher and to see something on ultrasound so it is also possible that she may be having a miscarriage.  Explained the importance of getting a repeat beta in 48 hours.  Patient voiced understanding, all questions were answered and she was stable at discharge.     FINAL CLINICAL IMPRESSION(S) / ED DIAGNOSES   Final diagnoses:  Abdominal pain in pregnancy, antepartum     Rx / DC Orders   ED Discharge Orders     None        Note:  This document was prepared using Dragon voice recognition software and may include unintentional dictation errors.   Cameron Ali, PA-C 02/27/24 Susan Santos    Jene Every, MD 02/29/24 [phone]

## 2024-02-27 NOTE — ED Provider Triage Note (Signed)
 Emergency Medicine Provider Triage Evaluation Note  Susan Santos , a 23 y.o. female  was evaluated in triage.  Pt complains of abdominal pain and history of possible [redacted] weeks pregnant.  Has not seen OB/GYN yet.  Denies vaginal d/c or bleeding.  No urinary sx.    Review of Systems  Positive: Generalized abdominal pain Negative: No n,v,d, no fever, vag bleeding or d/c  Physical Exam  BP 119/69 (BP Location: Left Arm)   Pulse 68   Temp 98.6 F (37 C) (Oral)   Resp 17   Ht 5\' 2"  (1.575 m)   Wt 71.2 kg   LMP 01/08/2024   SpO2 100%   BMI 28.72 kg/m  Gen:   Awake, no distress   Resp:  Normal effort  MSK:   Moves extremities without difficulty  Other:    Medical Decision Making  Medically screening exam initiated at 1:22 PM.  Appropriate orders placed.  Sima P Milanes was informed that the remainder of the evaluation will be completed by another provider, this initial triage assessment does not replace that evaluation, and the importance of remaining in the ED until their evaluation is complete.     Tommi Rumps, PA-C 02/27/24 1325

## 2024-02-27 NOTE — ED Notes (Signed)
 Pt evaluated at time of triage by Levada Schilling, [place]

## 2024-02-27 NOTE — Telephone Encounter (Signed)
 Pt called triage (transferred from front desk) with complaints of pelvic pain starting since last Saturday. Saturday was "cramping" but yesterday was pretty intense. From 1 to 10 a 7. Still going on. Denies vaginal spotting/bleeding. Advised to go to ER.

## 2024-02-28 NOTE — Telephone Encounter (Signed)
 TRIAGE VOICEMAIL: Patient requesting to schedule f/u beta labs.

## 2024-03-01 ENCOUNTER — Other Ambulatory Visit

## 2024-03-01 ENCOUNTER — Telehealth: Payer: Self-pay

## 2024-03-01 DIAGNOSIS — O200 Threatened abortion: Secondary | ICD-10-CM

## 2024-03-01 NOTE — Telephone Encounter (Signed)
 [facility] requesting lab appointment for follow up beta per 02/27/24 ED visit. They do not have beta testing in their clinic. Appointment scheduled for today at 2:20.  02/27/24 ED visit: FINDINGS: Intrauterine gestational sac: Probable small intrauterine gestational sac.   Yolk sac:  Not Visualized.   Embryo:  Not Visualized.   Cardiac Activity: Not Visualized.   MSD: 3.2 mm   5 w   0 d   Subchorionic hemorrhage:  None visualized.   Maternal uterus/adnexae: Uterus is anteverted. No myometrial mass lesions. Both ovaries are visualized. Corpus luteal cyst suggested on the left. No abnormal adnexal masses. No free fluid.   IMPRESSION: Probable early intrauterine gestational sac, but no yolk sac, fetal pole, or cardiac activity yet visualized. Recommend follow-up quantitative B-HCG levels and follow-up US in 14 days to assess viability.

## 2024-03-02 LAB — BETA HCG QUANT (REF LAB): hCG Quant: 1317 m[IU]/mL

## 2024-03-12 ENCOUNTER — Ambulatory Visit: Admitting: Certified Nurse Midwife

## 2024-03-12 DIAGNOSIS — Z113 Encounter for screening for infections with a predominantly sexual mode of transmission: Secondary | ICD-10-CM

## 2024-03-13 ENCOUNTER — Other Ambulatory Visit: Payer: Self-pay

## 2024-03-13 ENCOUNTER — Encounter: Payer: Self-pay | Admitting: *Deleted

## 2024-03-13 DIAGNOSIS — O039 Complete or unspecified spontaneous abortion without complication: Secondary | ICD-10-CM | POA: Insufficient documentation

## 2024-03-13 DIAGNOSIS — N939 Abnormal uterine and vaginal bleeding, unspecified: Secondary | ICD-10-CM | POA: Diagnosis present

## 2024-03-13 LAB — COMPREHENSIVE METABOLIC PANEL WITH GFR
ALT: 14 U/L (ref 0–44)
AST: 17 U/L (ref 15–41)
Albumin: 4 g/dL (ref 3.5–5.0)
Alkaline Phosphatase: 58 U/L (ref 38–126)
Anion gap: 8 (ref 5–15)
BUN: 13 mg/dL (ref 6–20)
CO2: 22 mmol/L (ref 22–32)
Calcium: 9.1 mg/dL (ref 8.9–10.3)
Chloride: 108 mmol/L (ref 98–111)
Creatinine, Ser: 0.65 mg/dL (ref 0.44–1.00)
GFR, Estimated: >60 mL/min (ref >60)
Glucose, Bld: 114 mg/dL — ABNORMAL HIGH (ref 70–99)
Potassium: 3.8 mmol/L (ref 3.5–5.1)
Sodium: 138 mmol/L (ref 135–145)
Total Bilirubin: 0.4 mg/dL (ref 0.0–1.2)
Total Protein: 7.5 g/dL (ref 6.5–8.1)

## 2024-03-13 LAB — CBC
HCT: 37.7 % (ref 36.0–46.0)
Hemoglobin: 13 g/dL (ref 12.0–15.0)
MCH: 31.4 pg (ref 26.0–34.0)
MCHC: 34.5 g/dL (ref 30.0–36.0)
MCV: 91.1 fL (ref 80.0–100.0)
Platelets: 286 10*3/uL (ref 150–400)
RBC: 4.14 MIL/uL (ref 3.87–5.11)
RDW: 12.1 % (ref 11.5–15.5)
WBC: 7.7 10*3/uL (ref 4.0–10.5)
nRBC: 0 % (ref 0.0–0.2)

## 2024-03-13 LAB — URINALYSIS, ROUTINE W REFLEX MICROSCOPIC
Bacteria, UA: NONE SEEN
RBC / HPF: >50 RBC/hpf (ref 0–5)
WBC, UA: 0 WBC/hpf (ref 0–5)

## 2024-03-13 LAB — HCG, QUANTITATIVE, PREGNANCY: hCG, Beta Chain, Quant, S: 347 m[IU]/mL — ABNORMAL HIGH (ref <5)

## 2024-03-13 NOTE — ED Triage Notes (Signed)
 Pt ambulatory to triage.  Pt reports she is [redacted] weeks pregnant.  Pt has vaginal bleeding that began 2 days ago.  Pt has abd cramping.  Denies urinary sx.  Pt alert.

## 2024-03-14 ENCOUNTER — Emergency Department
Admission: EM | Admit: 2024-03-14 | Discharge: 2024-03-14 | Disposition: A | Discharge status: Home / Self Care | Attending: Emergency Medicine | Admitting: Emergency Medicine

## 2024-03-14 ENCOUNTER — Emergency Department

## 2024-03-14 DIAGNOSIS — O039 Complete or unspecified spontaneous abortion without complication: Secondary | ICD-10-CM

## 2024-03-14 NOTE — ED Provider Notes (Signed)
 [facility] Provider Note    Event Date/Time   First MD Initiated Contact with Patient 03/14/24 0120     (approximate)   History   Vaginal Bleeding   HPI  Susan Santos is a 23 y.o. female G3, P1 approximately [redacted] weeks pregnant by dates who presents to the ED from home with a chief complaint of vaginal bleeding x 3 days.  Shows me several pictures of large blood clots and tissue.  Endorses mild pelvic cramping.  Denies associated fever/chills, chest pain, shortness of breath, nausea/vomiting or dizziness.  Denies anticoagulant use.     Past Medical History   Past Medical History:  Diagnosis Date   Gallstones    Postpartum anemia 07/26/2017   Vaginal delivery 2018   girl   Vaginal delivery 2022   boy     Active Problem List   Patient Active Problem List   Diagnosis Date Noted   Symptomatic cholelithiasis 03/07/2019     Past Surgical History   Past Surgical History:  Procedure Laterality Date   CHOLECYSTECTOMY N/A 06/11/2019   Procedure: LAPAROSCOPIC CHOLECYSTECTOMY;  Surgeon: Henrene Dodge, MD;  Location: [facility] ORS;  Service: General;  Laterality: N/A;   HEMATOMA EVACUATION N/A 03/23/2023   Procedure: EVACUATION OF VULVAR HEMATOMA-REPAIR OF LABIA;  Surgeon: Linzie Collin, MD;  Location: [facility] ORS;  Service: Gynecology;  Laterality: N/A;     Home Medications   Prior to Admission medications   Medication Sig Start Date End Date Taking? Authorizing Provider  cephALEXin (KEFLEX) 500 MG capsule Take 1 capsule (500 mg total) by mouth 2 (two) times daily. 05/06/23   Jene Every, MD     Allergies  Patient has no known allergies.   Family History   Family History  Problem Relation Age of Onset   Diabetes Mother    Diabetes Maternal Uncle    Diabetes Maternal Grandmother    Breast cancer Neg Hx    Hypertension Neg Hx      Physical Exam  Triage Vital Signs: ED Triage Vitals  Encounter Vitals Group     BP 03/13/24 2152  115/73     Systolic BP Percentile --      Diastolic BP Percentile --      Pulse Rate 03/13/24 2152 67     Resp 03/13/24 2152 18     Temp 03/13/24 2152 97.9 F (36.6 C)     Temp Source 03/13/24 2152 Oral     SpO2 03/13/24 2152 100 %     Weight 03/13/24 2153 160 lb (72.6 kg)     Height 03/13/24 2153 5\' 2"  (1.575 m)     Head Circumference --      Peak Flow --      Pain Score 03/13/24 2200 5     Pain Loc --      Pain Education --      Exclude from Growth Chart --     Updated Vital Signs: BP 102/60 (BP Location: Right Arm)   Pulse 67   Temp 98.3 F (36.8 C) (Oral)   Resp 18   Ht 5\' 2"  (1.575 m)   Wt 72.6 kg   LMP 02/01/2024 (Approximate)   SpO2 100%   BMI 29.26 kg/m    General: Awake, no distress.  CV:  RRR.  Good peripheral perfusion.  Resp:  Normal effort.  CTAB. Abd:  Nontender to light or deep palpation.  No distention.  Other:  No truncal vesicles.   ED Results /  Procedures / Treatments  Labs (all labs ordered are listed, but only abnormal results are displayed) Labs Reviewed  COMPREHENSIVE METABOLIC PANEL WITH GFR - Abnormal; Notable for the following components:      Result Value   Glucose, Bld 114 (*)    All other components within normal limits  URINALYSIS, ROUTINE W REFLEX MICROSCOPIC - Abnormal; Notable for the following components:   Color, Urine RED (*)    APPearance CLOUDY (*)    Glucose, UA   (*)    Value: TEST NOT REPORTED DUE TO COLOR INTERFERENCE OF URINE PIGMENT   Hgb urine dipstick   (*)    Value: TEST NOT REPORTED DUE TO COLOR INTERFERENCE OF URINE PIGMENT   Bilirubin Urine   (*)    Value: TEST NOT REPORTED DUE TO COLOR INTERFERENCE OF URINE PIGMENT   Ketones, ur   (*)    Value: TEST NOT REPORTED DUE TO COLOR INTERFERENCE OF URINE PIGMENT   Protein, ur   (*)    Value: TEST NOT REPORTED DUE TO COLOR INTERFERENCE OF URINE PIGMENT   Nitrite   (*)    Value: TEST NOT REPORTED DUE TO COLOR INTERFERENCE OF URINE PIGMENT   Leukocytes,Ua   (*)     Value: TEST NOT REPORTED DUE TO COLOR INTERFERENCE OF URINE PIGMENT   All other components within normal limits  HCG, QUANTITATIVE, PREGNANCY - Abnormal; Notable for the following components:   hCG, Beta Chain, Quant, S 347 (*)    All other components within normal limits  CBC     EKG  None   RADIOLOGY I have independently visualized and interpreted patient's imaging study as well as noted the radiology interpretation:  Ultrasound: Consistent with failed pregnancy  Official radiology report(s): US OB LESS THAN 14 WEEKS WITH OB TRANSVAGINAL Result Date: 03/14/2024 CLINICAL DATA:  Vaginal bleeding for 2 days. EXAM: OBSTETRIC <14 WK [place] AND TRANSVAGINAL OB US TECHNIQUE: Both transabdominal and transvaginal ultrasound examinations were performed for complete evaluation of the gestation as well as the maternal uterus, adnexal regions, and pelvic cul-de-sac. Transvaginal technique was performed to assess early pregnancy. COMPARISON:  02/27/2024 FINDINGS: Intrauterine gestational sac: None Yolk sac:  Not Visualized. Embryo:  Not Visualized. Cardiac Activity: Not Visualized. Heart Rate: Not applicable Subchorionic hemorrhage:  None visualized. Maternal uterus/adnexae: Right ovary: Normal Left ovary: Normal Other :The endometrium appears normal measuring 6 mm in thickness. No adnexal mass identified. Free fluid:  None IMPRESSION: 1. Previously noted gestational sac is no longer visualized. Findings meet definitive criteria for failed pregnancy. This follows SRU consensus guidelines: Diagnostic Criteria for Nonviable Pregnancy Early in the First Trimester. [facility] [phone]. Electronically Signed   By: Signa Kell M.D.   On: 03/14/2024 05:46     PROCEDURES:  Critical Care performed: No  Procedures   MEDICATIONS ORDERED IN ED: Medications - No data to display   IMPRESSION / MDM / ASSESSMENT AND PLAN / ED COURSE  I reviewed the triage vital signs and the nursing notes.                              23 year old female approximately [redacted] weeks pregnant by dates presenting with vaginal bleeding. Differential diagnosis includes, but is not limited to, ovarian cyst, ovarian torsion, acute appendicitis, diverticulitis, urinary tract infection/pyelonephritis, endometriosis, bowel obstruction, colitis, renal colic, gastroenteritis, hernia, fibroids, endometriosis, pregnancy related pain including ectopic pregnancy, etc. I have personally reviewed patient's records  and note appointment for GYN follow-up scheduled for/06/2024 for threatened miscarriage following ED visit on 02/27/2024.  Patient's presentation is most consistent with acute complicated illness / injury requiring diagnostic workup.  Laboratory results demonstrate stable hemoglobin of 13, downtrending beta-hCG 347 which is down from 924 2 weeks ago.  Patient awaiting pelvic ultrasound.  She is O+ blood type.  Clinical Course as of 03/14/24 0554  Wed Mar 14, 2024  1610 Delay due to radiology.  Ultrasound demonstrates failed pregnancy.  Patient will follow-up closely with her GYN.  Strict return precautions given.  Patient verbalizes understanding and agrees with plan of care. [JS]    Clinical Course User Index [JS] Irean Hong, MD     FINAL CLINICAL IMPRESSION(S) / ED DIAGNOSES   Final diagnoses:  Miscarriage     Rx / DC Orders   ED Discharge Orders     None        Note:  This document was prepared using Dragon voice recognition software and may include unintentional dictation errors.   Irean Hong, MD 03/14/24 [phone]

## 2024-03-14 NOTE — Discharge Instructions (Addendum)
 Drink plenty of fluids daily.  Return to the ER for worsening symptoms, soaking more than 1 maxi pad per hour, fainting or other concerns.

## 2024-10-19 DIAGNOSIS — R059 Cough, unspecified: Secondary | ICD-10-CM | POA: Diagnosis present

## 2024-10-19 DIAGNOSIS — J069 Acute upper respiratory infection, unspecified: Secondary | ICD-10-CM | POA: Insufficient documentation

## 2024-10-19 NOTE — ED Triage Notes (Signed)
 Patient ambulatory to triage with complaints of cough, chills, and runny nose x1 wee. States she has been taking OTC cold medicine and robitussin but feels the cough is getting worse.

## 2024-10-20 ENCOUNTER — Encounter: Payer: Self-pay | Admitting: Emergency Medicine

## 2024-10-20 ENCOUNTER — Other Ambulatory Visit: Payer: Self-pay

## 2024-10-20 ENCOUNTER — Emergency Department

## 2024-10-20 ENCOUNTER — Emergency Department
Admission: EM | Admit: 2024-10-20 | Discharge: 2024-10-20 | Disposition: A | Discharge status: Home / Self Care | Attending: Emergency Medicine | Admitting: Emergency Medicine

## 2024-10-20 DIAGNOSIS — J189 Pneumonia, unspecified organism: Secondary | ICD-10-CM

## 2024-10-20 LAB — RESP PANEL BY RT-PCR (RSV, FLU A&B, COVID)  RVPGX2
Influenza A by PCR: NEGATIVE
Influenza B by PCR: NEGATIVE
Resp Syncytial Virus by PCR: NEGATIVE
SARS Coronavirus 2 by RT PCR: NEGATIVE

## 2024-10-20 MED ORDER — AMOXICILLIN 500 MG PO CAPS
1000.0000 mg | ORAL_CAPSULE | Freq: Three times a day (TID) | ORAL | Status: AC
  Administered 2024-10-20: 1000 mg via ORAL
  Filled 2024-10-20: qty 2

## 2024-10-20 MED ORDER — AMOXICILLIN 500 MG PO TABS: 1000.0000 mg | ORAL_TABLET | Freq: Three times a day (TID) | ORAL | 0 refills | Status: AC

## 2024-10-20 NOTE — ED Provider Notes (Signed)
 [facility] Provider Note    Event Date/Time   First MD Initiated Contact with Patient 10/20/24 0104     (approximate)   History   URI   HPI  Susan Santos is a 23 y.o. female   Past medical history of significant past medical history who presents to the Emergency Department with 1 week of viral URI symptoms including cough, congestion, which was initially improving but then worsened over the last 2 days with now body aches, chills, worsening productive cough.  No known sick contacts.  Fully vaccinated as a child.  No other acute medical complaints    External Medical Documents Reviewed: Prior outpatient notes      Physical Exam   Triage Vital Signs: ED Triage Vitals  Encounter Vitals Group     BP 10/20/24 0003 117/80     Girls Systolic BP Percentile --      Girls Diastolic BP Percentile --      Boys Systolic BP Percentile --      Boys Diastolic BP Percentile --      Pulse Rate 10/20/24 0003 87     Resp 10/20/24 0003 16     Temp 10/20/24 0003 98.3 F (36.8 C)     Temp Source 10/20/24 0003 Oral     SpO2 10/20/24 0003 100 %     Weight 10/20/24 0000 173 lb (78.5 kg)     Height 10/20/24 0000 5' 2 (1.575 m)     Head Circumference --      Peak Flow --      Pain Score 10/20/24 0000 0     Pain Loc --      Pain Education --      Exclude from Growth Chart --     Most recent vital signs: Vitals:   10/20/24 0003  BP: 117/80  Pulse: 87  Resp: 16  Temp: 98.3 F (36.8 C)  SpO2: 100%    General: Awake, no distress.  CV:  Good peripheral perfusion.  Resp:  Normal effort.  Abd:  No distention.  Other:  Normal hemodynamics no fever not toxic appearing breathing comfortably on room air but with decreased breath sounds on the left compared to right side   ED Results / Procedures / Treatments   Labs (all labs ordered are listed, but only abnormal results are displayed) Labs Reviewed  RESP PANEL BY RT-PCR (RSV, FLU A&B, COVID)  [id number]      I ordered and reviewed the above labs they are notable for negative viral panel    RADIOLOGY I independently reviewed and interpreted chest x-ray and see questionable opacity left mid/lower lung field I also reviewed radiologist's formal read.   PROCEDURES:  Critical Care performed: No  Procedures   MEDICATIONS ORDERED IN ED: Medications  amoxicillin (AMOXIL) capsule 1,000 mg (1,000 mg Oral Given 10/20/24 0202)     IMPRESSION / MDM / ASSESSMENT AND PLAN / ED COURSE  I reviewed the triage vital signs and the nursing notes.                                Patient's presentation is most consistent with acute presentation with potential threat to life or bodily function.  Differential diagnosis includes, but is not limited to, viral URI, bacterial pneumonia, considered but less likely sepsis   The patient is on the cardiac monitor to evaluate for evidence of arrhythmia and/or significant heart  rate changes.  MDM:     Double sickening with initially viral symptoms but now 2 days of worsening cough, chills, and decreased breath sounds on the left side and a question opacity corresponding on chest x-ray, not formally called opacity by radiology but given her symptoms and clinical exam we will treat for community-acquired pneumonia.  I considered hospitalization for admission or observation given overall well appearance, young healthy patient, normal vital signs no hypoxemia or respiratory distress, I doubt life-threatening infection at this time that can be managed appropriately as outpatient, with PMD follow-up and close return precautions.        FINAL CLINICAL IMPRESSION(S) / ED DIAGNOSES   Final diagnoses:  Pneumonia of left lung due to infectious organism, unspecified part of lung     Rx / DC Orders   ED Discharge Orders          Ordered    amoxicillin (AMOXIL) 500 MG tablet  3 times daily        10/20/24 0157             Note:  This document  was prepared using Dragon voice recognition software and may include unintentional dictation errors.    Cyrena Mylar, MD 10/20/24 [phone]

## 2024-10-20 NOTE — Discharge Instructions (Signed)
 Take amoxicillin antibiotic 3 times daily as prescribed for the full 5-day course  Take acetaminophen  650 mg and ibuprofen  400 mg every 6 hours for pain.  Take with food.  Drink plenty of fluids to stay well-hydrated.  Find Pedialyte or similar electrolyte rehydration formulas at your local pharmacy.  Thank you for choosing us  for your health care today!  Please see your primary doctor this week for a follow up appointment.   If you have any new, worsening, or unexpected symptoms call your doctor right away or come back to the emergency department for reevaluation.  It was my pleasure to care for you today.   Ginnie EDISON Cyrena, MD

## 2024-12-31 ENCOUNTER — Ambulatory Visit

## 2024-12-31 NOTE — Progress Notes (Unsigned)
" ° ° °  NURSE VISIT NOTE  Subjective:    Patient ID: SUHAILA TROIANO, female    DOB: Apr 26, 2001, 24 y.o.   MRN: [id number]  HPI  Patient is a 24 y.o. [phone] female who presents for evaluation of amenorrhea. She believes she could be pregnant. {pregnancy desire:14500::Pregnancy is desired.} Sexual Activity: {sexual partners:705}. Current symptoms also include: {preg sx:18128}. Last period was {norm/abn:16337}.    Objective:    There were no vitals taken for this visit.  Lab Review  No results found for any visits on 01/01/25.  Assessment:   No diagnosis found.  Plan:   {AOB + PREGNANCY PLAN:28596:p}  {AOB - PREGNANCY PLAN:28597:p}   Burnard LITTIE Ro, CMA   "

## 2025-01-01 ENCOUNTER — Ambulatory Visit

## 2025-01-01 VITALS — BP 116/70 | HR 78 | Ht 62.0 in | Wt 173.8 lb

## 2025-01-01 DIAGNOSIS — N912 Amenorrhea, unspecified: Secondary | ICD-10-CM

## 2025-01-01 DIAGNOSIS — Z3201 Encounter for pregnancy test, result positive: Secondary | ICD-10-CM

## 2025-01-01 DIAGNOSIS — Z3491 Encounter for supervision of normal pregnancy, unspecified, first trimester: Secondary | ICD-10-CM

## 2025-01-01 LAB — POCT URINE PREGNANCY: Preg Test, Ur: POSITIVE — AB

## 2025-01-18 ENCOUNTER — Telehealth

## 2025-02-04 ENCOUNTER — Other Ambulatory Visit

## 2025-02-18 ENCOUNTER — Encounter: Admitting: Obstetrics & Gynecology
